# Patient Record
Sex: Female | Born: 2000 | Race: Black or African American | Hispanic: No | Smoking: Never smoker
Health system: Southern US, Community
[De-identification: ages and names within clinical notes are randomized; demographics above are authoritative.]

## PROBLEM LIST (undated history)

## (undated) ENCOUNTER — Inpatient Hospital Stay (HOSPITAL_COMMUNITY): Payer: Self-pay

## (undated) DIAGNOSIS — I10 Essential (primary) hypertension: Secondary | ICD-10-CM

## (undated) DIAGNOSIS — O139 Gestational [pregnancy-induced] hypertension without significant proteinuria, unspecified trimester: Secondary | ICD-10-CM

## (undated) DIAGNOSIS — Z789 Other specified health status: Secondary | ICD-10-CM

## (undated) DIAGNOSIS — F99 Mental disorder, not otherwise specified: Secondary | ICD-10-CM

## (undated) HISTORY — DX: Mental disorder, not otherwise specified: F99

## (undated) HISTORY — DX: Essential (primary) hypertension: I10

## (undated) HISTORY — PX: TUMOR REMOVAL: SHX12

## (undated) HISTORY — DX: Gestational (pregnancy-induced) hypertension without significant proteinuria, unspecified trimester: O13.9

---
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
## 2019-01-06 ENCOUNTER — Other Ambulatory Visit: Payer: Self-pay

## 2019-01-06 ENCOUNTER — Ambulatory Visit (INDEPENDENT_AMBULATORY_CARE_PROVIDER_SITE_OTHER): Payer: Medicaid Other | Admitting: Student in an Organized Health Care Education/Training Program

## 2019-01-06 ENCOUNTER — Encounter: Payer: Self-pay | Admitting: Student in an Organized Health Care Education/Training Program

## 2019-01-06 VITALS — BP 92/62 | HR 66 | Wt 104.8 lb

## 2019-01-06 DIAGNOSIS — Z538 Procedure and treatment not carried out for other reasons: Secondary | ICD-10-CM

## 2019-02-18 ENCOUNTER — Encounter: Payer: Self-pay | Admitting: Student in an Organized Health Care Education/Training Program

## 2019-03-26 IMAGING — US US OB TRANSVAGINAL
1 series · 15 of 28 positions shown · non-contrast
Comparison: 09/16/2017

CLINICAL DATA: First trimester pregnancy with inconclusive fetal
viability. Current assigned gestational age of 8 weeks 6 days by
prior ultrasound.

EXAM:
TRANSVAGINAL OB ULTRASOUND
TECHNIQUE: Transvaginal ultrasound was performed for complete evaluation of the
gestation as well as the maternal uterus, adnexal regions, and
pelvic cul-de-sac.

[Series 1: us ob transvaginal · 29 acquisitions, 15 frames shown]
[im 1/29]
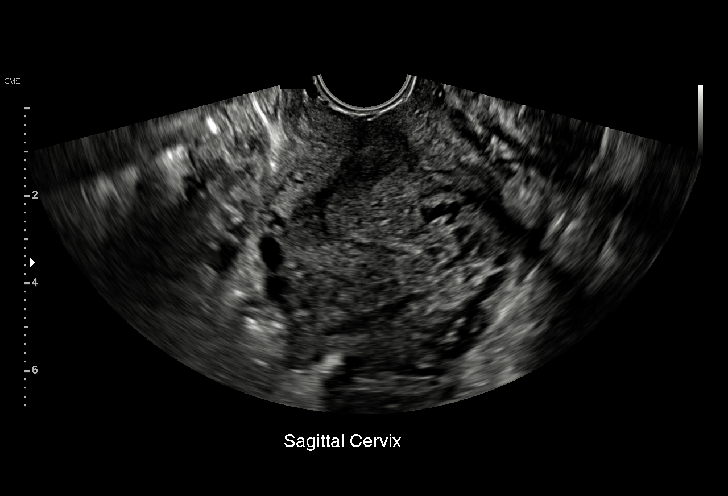
[im 3/29]
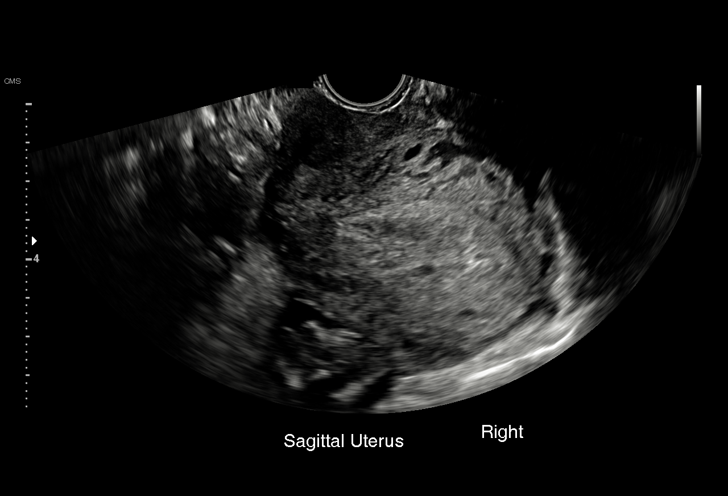
[im 5/29]
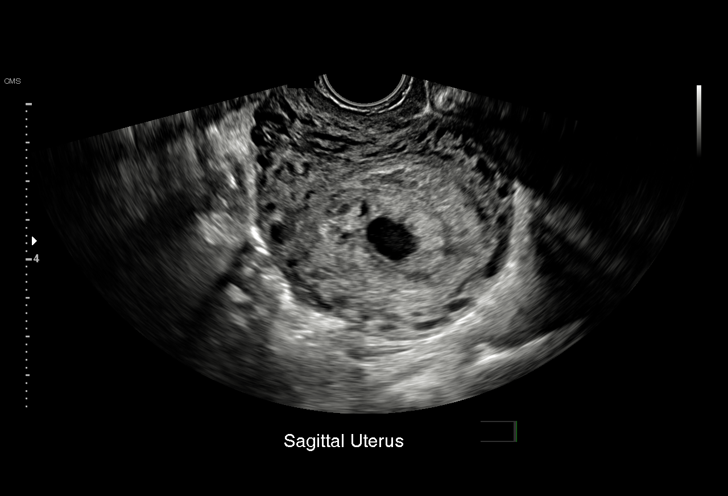
[im 7/29]
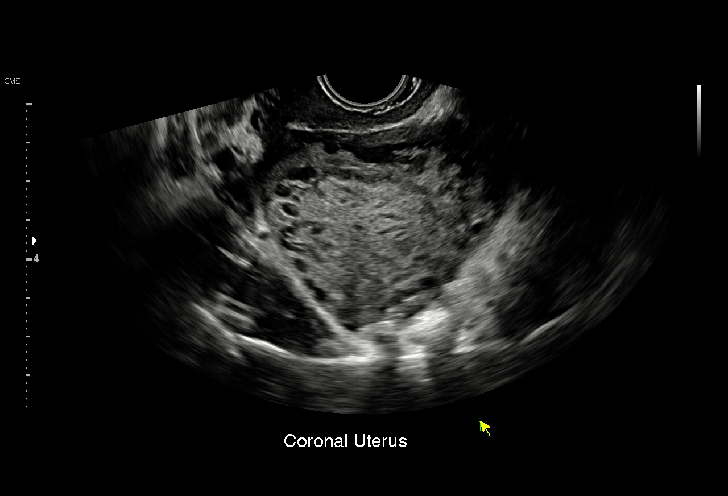
[im 9/29]
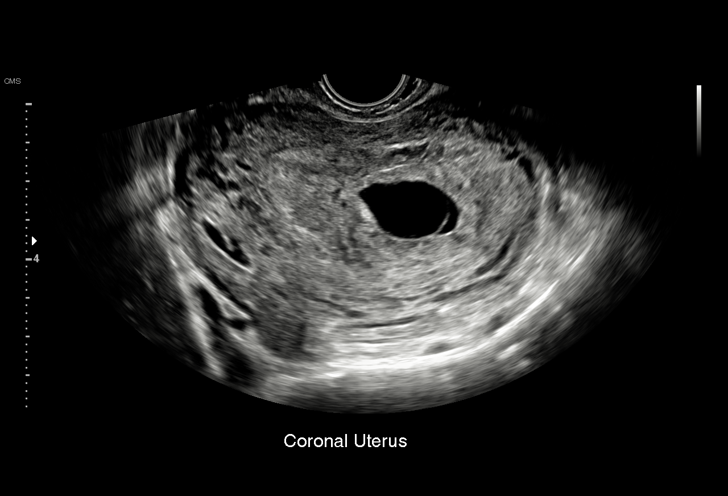
[im 11/29]
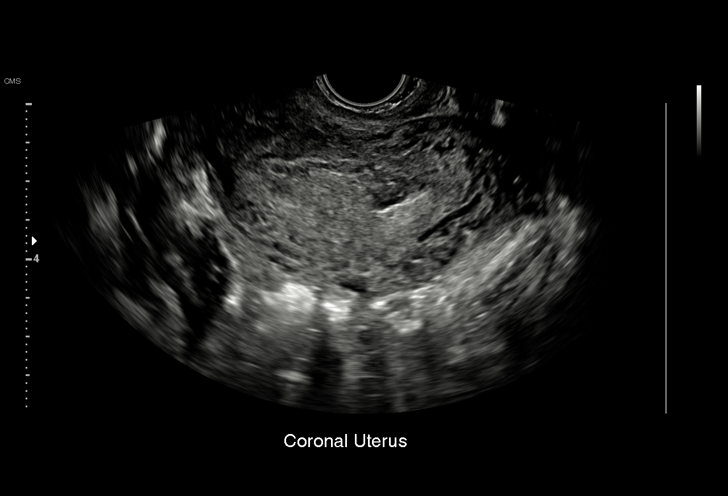
[im 13/29]
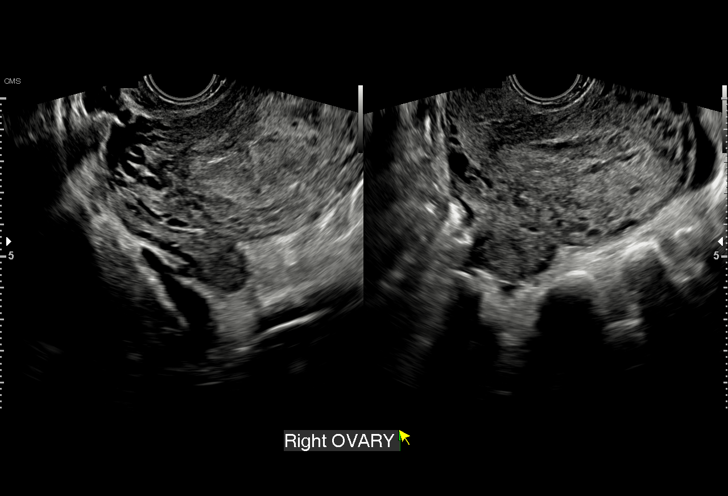
[im 15/29]
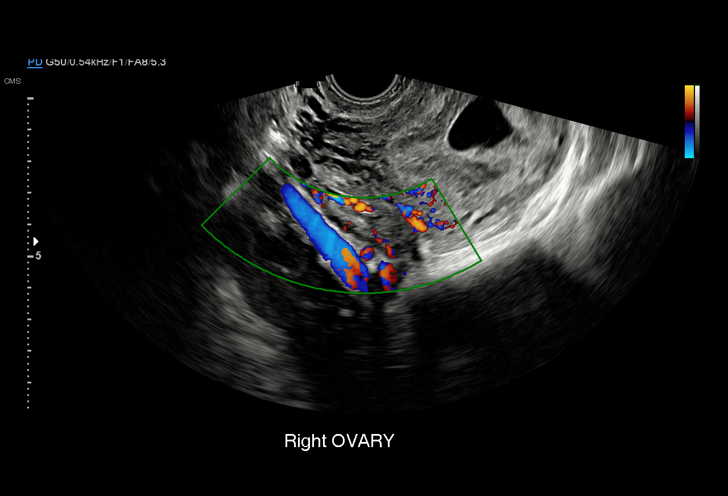
[im 16/29]
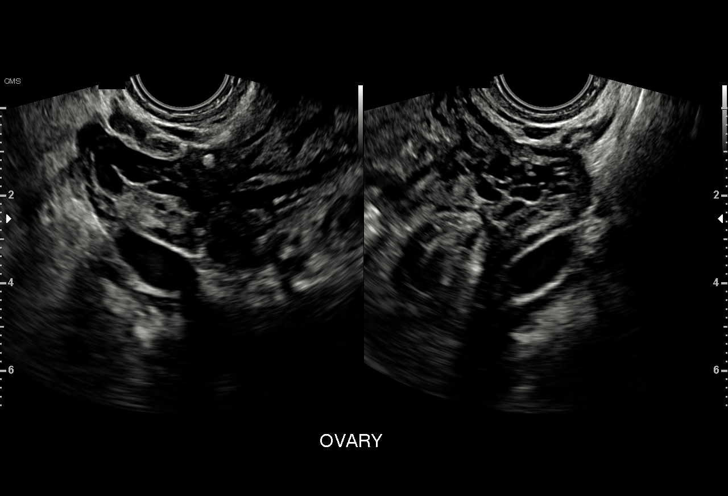
[im 18/29]
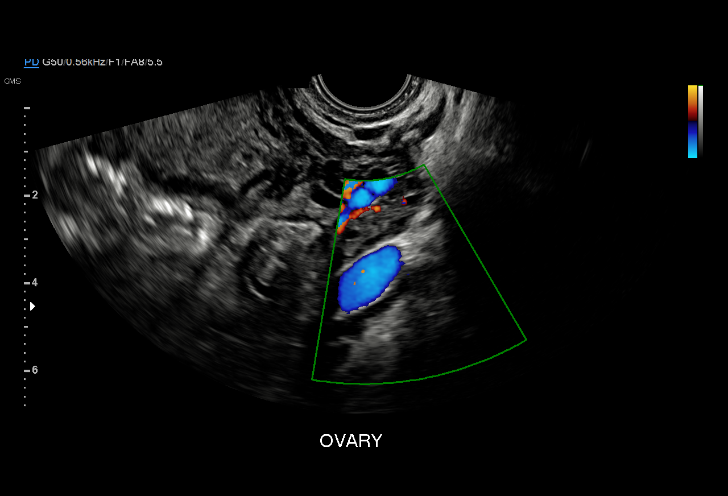
[im 20/29]
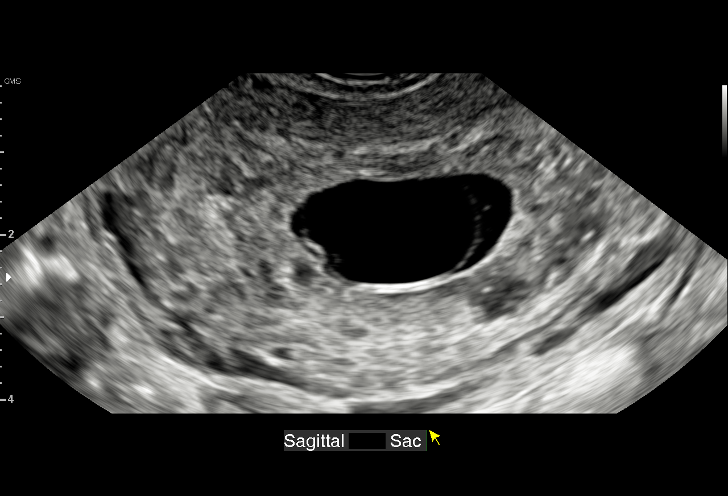
[im 22/29]
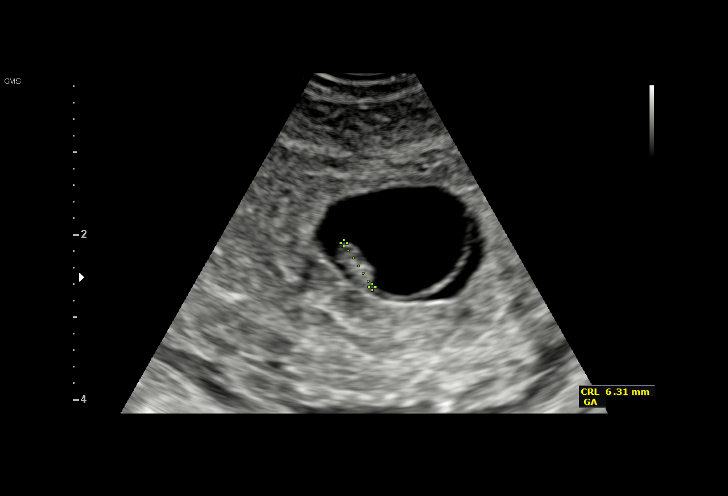
[im 24/29]
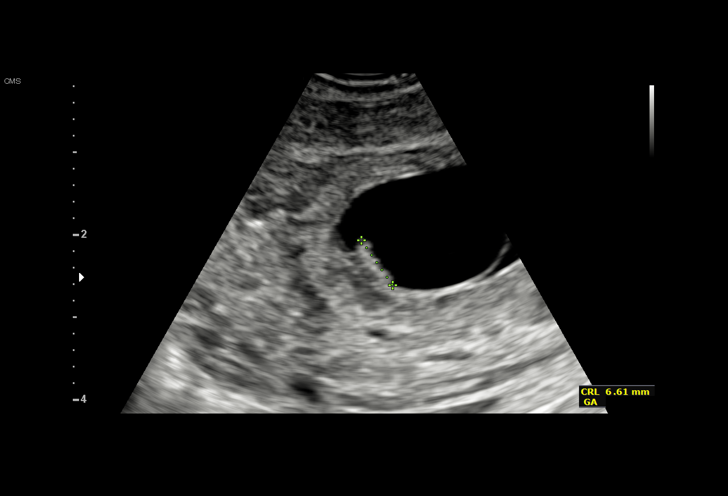
[im 26/29]
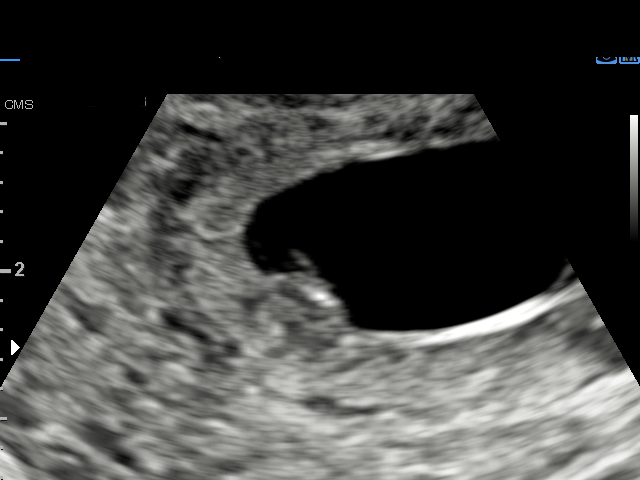
[im 29/29]
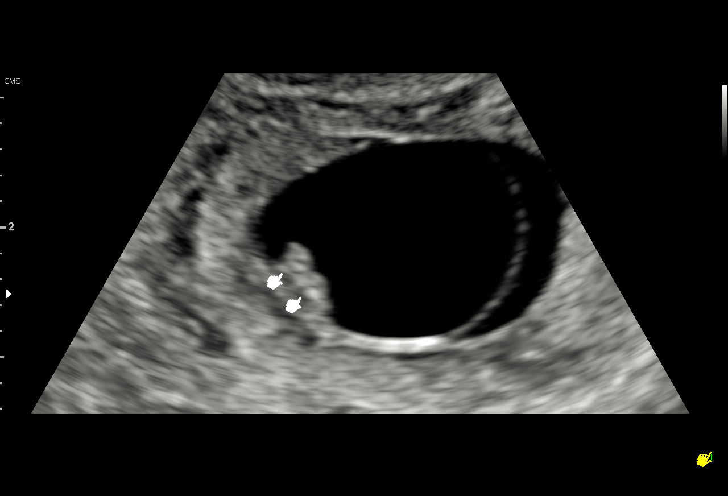

[15 of 28 positions shown; findings below may reference images not displayed]

FINDINGS: Intrauterine gestational sac: Single

Yolk sac:  Visualized.

Embryo:  Visualized.

Cardiac Activity: Absent

CRL:   6 mm   6 w 3 d                  US EDC: 05/29/2018

Subchorionic hemorrhage:  None visualized.

Maternal uterus/adnexae: Normal appearance of both ovaries. No mass
or abnormal free fluid identified.
IMPRESSION: Single IUP with absence of cardiac activity and lack of progression
since previous study, consistent with failed IUP.

## 2019-04-10 ENCOUNTER — Other Ambulatory Visit: Payer: Self-pay

## 2019-04-10 ENCOUNTER — Ambulatory Visit (INDEPENDENT_AMBULATORY_CARE_PROVIDER_SITE_OTHER): Payer: Medicaid Other | Admitting: Family Medicine

## 2019-04-10 VITALS — BP 101/80 | HR 82 | Wt 102.8 lb

## 2019-04-10 DIAGNOSIS — Z30019 Encounter for initial prescription of contraceptives, unspecified: Secondary | ICD-10-CM

## 2019-04-10 LAB — POCT URINE PREGNANCY: Preg Test, Ur: NEGATIVE

## 2019-04-10 MED ORDER — XULANE 150-35 MCG/24HR TD PTWK: 1.0000 | MEDICATED_PATCH | TRANSDERMAL | 12 refills | Status: DC

## 2019-04-10 NOTE — Progress Notes (Signed)
    SUBJECTIVE:   CHIEF COMPLAINT / HPI:   Discuss birth control Patient presents today to discuss birth control.  Patient would like the patch.  Has used patches in the past and liked them.  States that they worked for her because they regulated her periods as well.  She recently had a child 5 months ago and has not been on any birth control.  Is not breast-feeding her children.  Has another child that is almost 20 years old.  Last intercourse was in over 1 to 2 weeks ago, was unprotected at the time.  Patient does not desire any STD testing today.  LMP February 22-26.  Denies h/o blood clots in herself, her sister did have a blood clot. Denies h/o migraines. Not interested in any LARCs.  PERTINENT  PMH / PSH: h/o gestational HTN   OBJECTIVE:   BP 101/80   Pulse 82   Wt 102 lb 12.8 oz (46.6 kg)   SpO2 96%   BMI 17.65 kg/m   Gen: Awake and alert, no apparent distress Cardio: RRR, no MRG  Resp: CTAB, no increased WOB Ext: no edema, no tenderness   ASSESSMENT/PLAN:   Contraception management Patient with desire for Xulane patches.  Last intercourse was over 1 to 2 weeks ago.  Pregnancy test today was negative.  Patient has tolerated Xulane patches in the past without any difficulty.  We'll plan to represcribe cooling patches.  Advised to follow-up if there is developed side effects.  Patient is no longer breast-feeding and was able to use estrogen containing birth control products.  Discussed LARCs with patient, she does not desire any at this time but will plan to schedule appointment with me if she does.     Oralia Manis, DO  PGY-3 [facility]

## 2019-04-10 NOTE — Assessment & Plan Note (Signed)
Patient with desire for Xulane patches.  Last intercourse was over 1 to 2 weeks ago.  Pregnancy test today was negative.  Patient has tolerated Xulane patches in the past without any difficulty.  We'll plan to represcribe cooling patches.  Advised to follow-up if there is developed side effects.  Patient is no longer breast-feeding and was able to use estrogen containing birth control products.  Discussed LARCs with patient, she does not desire any at this time but will plan to schedule appointment with me if she does.

## 2019-04-10 NOTE — Patient Instructions (Signed)

## 2019-05-01 ENCOUNTER — Encounter: Payer: Self-pay | Admitting: Family Medicine

## 2019-05-01 ENCOUNTER — Other Ambulatory Visit: Payer: Self-pay | Admitting: Family Medicine

## 2019-05-01 MED ORDER — XULANE 150-35 MCG/24HR TD PTWK: 1.0000 | MEDICATED_PATCH | TRANSDERMAL | 12 refills | Status: DC

## 2019-05-19 ENCOUNTER — Encounter: Payer: Self-pay | Admitting: Family Medicine

## 2019-05-19 ENCOUNTER — Ambulatory Visit: Payer: Self-pay | Admitting: *Deleted

## 2019-05-19 NOTE — Telephone Encounter (Signed)
Pt complains of vaginal pain when she sits, and a pus- like discharge; the discharge does not have an odor; she rates her pain at 2-3 out of 10; recommendations made per nurse triage protocol; she verbalized understanding.  Reason for Disposition . [1] MILD-MODERATE pain AND [2] present > 24 hours  Answer Assessment - Initial Assessment Questions 1. SYMPTOM: "What's the main symptom you're concerned about?" (e.g., pain, itching, dryness)     pain 2. LOCATION: "Where is the  located?" (e.g., inside/outside, left/right)    inside 3. ONSET: "When did the  start?"     05/15/19 4. PAIN: "Is there any pain?" If so, ask: "How bad is it?" (Scale: 1-10; mild, moderate, severe)   pain 5. ITCHING: "Is there any itching?" If so, ask: "How bad is it?" (Scale: 1-10; mild, moderate, severe)     none 6. CAUSE: "What do you think is causing the discharge?" "Have you had the same problem before? What happened then?"     Not sure 7. OTHER SYMPTOMS: "Do you have any other symptoms?" (e.g., fever, itching, vaginal bleeding, pain with urination, injury to genital area, vaginal foreign body)    Bumps in vagina, external irriation 8. PREGNANCY: "Is there any chance you are pregnant?" "When was your last menstrual period?"     Patch; LMP 03/31/19  Protocols used: VAGINAL [facility]

## 2019-08-02 NOTE — Progress Notes (Signed)
    SUBJECTIVE:   CHIEF COMPLAINT / HPI:  Nausea and possible pregnancy.  Patient reports that she took home pregnancy test and was negative but she has been feeling nauseated like she is pregnant.  LMP 06/21 for 6 days but normally 3 days.  She started the patch about 3-4 months ago and reports compliancy weekly on Mondays. She is sexually active with one female partner in the last month and half. She endorses a lot of white vaginal discharge with a sweet odor.  Denies any vaginal itching, dyspareunia or urinary symptoms.  Reports some spotting yesterday.  She also endorses nausea x 2 weeks.  She reports that her appetite initially was poor but it has now increased.   PERTINENT  PMH / PSH:  [id number]  OBJECTIVE:   BP 100/76   Pulse 87   Ht 5\' 4"  (1.626 m)   Wt 97 lb 6.4 oz (44.2 kg)   SpO2 100%   BMI 16.72 kg/m    General: Alert and oriented, no apparent distress  Gastrointestinal: Bowel sounds present. No abdominal pain. Flat abdomen and non distension SVE: yellow vaginal discharge noted, cervix and vaginal vault appear normal, no lesions or abrasions Bimanual: No CMT, no adnexal masses    ASSESSMENT/PLAN:   Non-pregnancy nausea Nausea likely secondary to contraceptive patch -Urine preg test negative -BHcg quant negative -wet mount positive for BV -Flagyl 500 mg BID x 7 days -Zofran 4mg  ODT q8h prn -If continues to have nausea consider switching contraceptive method. -Follow up with PCP as needed     , MD [facility]

## 2019-08-02 NOTE — Patient Instructions (Addendum)
Thank you for coming to see me today. It was a pleasure.   Will call you with results if abnormal  Please follow-up with PCP as needed  If you have any questions or concerns, please do not hesitate to call the office at [phone].  Best,   Dana Allan, MD [facility]

## 2019-08-03 ENCOUNTER — Ambulatory Visit (INDEPENDENT_AMBULATORY_CARE_PROVIDER_SITE_OTHER): Payer: Medicaid Other | Admitting: Family Medicine

## 2019-08-03 ENCOUNTER — Other Ambulatory Visit: Payer: Self-pay

## 2019-08-03 ENCOUNTER — Other Ambulatory Visit (HOSPITAL_COMMUNITY)
Admission: RE | Admit: 2019-08-03 | Discharge: 2019-08-03 | Disposition: A | Discharge status: Home / Self Care | Payer: Medicaid Other | Source: Ambulatory Visit | Attending: Family Medicine | Admitting: Family Medicine

## 2019-08-03 VITALS — BP 100/76 | HR 87 | Ht 64.0 in | Wt 97.4 lb

## 2019-08-03 DIAGNOSIS — R110 Nausea: Secondary | ICD-10-CM

## 2019-08-03 DIAGNOSIS — Z3200 Encounter for pregnancy test, result unknown: Secondary | ICD-10-CM | POA: Insufficient documentation

## 2019-08-03 LAB — POCT WET PREP (WET MOUNT)
Clue Cells Wet Prep Whiff POC: POSITIVE
Trichomonas Wet Prep HPF POC: ABSENT
WBC, Wet Prep HPF POC: >20

## 2019-08-03 LAB — POCT URINE PREGNANCY: Preg Test, Ur: NEGATIVE

## 2019-08-03 MED ORDER — ONDANSETRON 4 MG PO TBDP: 4.0000 mg | ORAL_TABLET | Freq: Three times a day (TID) | ORAL | 0 refills | Status: DC | PRN

## 2019-08-04 ENCOUNTER — Encounter: Payer: Self-pay | Admitting: Family Medicine

## 2019-08-04 LAB — BETA HCG QUANT (REF LAB): hCG Quant: <1 m[IU]/mL

## 2019-08-04 LAB — CERVICOVAGINAL ANCILLARY ONLY
Bacterial Vaginitis (gardnerella): POSITIVE — AB
Chlamydia: NEGATIVE
Comment: NEGATIVE
Comment: NEGATIVE
Comment: NORMAL
Neisseria Gonorrhea: NEGATIVE

## 2019-08-04 LAB — HCV INTERPRETATION

## 2019-08-04 LAB — HCV AB W REFLEX TO QUANT PCR: HCV Ab: <0.1 s/co ratio (ref 0.0–0.9)

## 2019-08-04 MED ORDER — METRONIDAZOLE 500 MG PO TABS
500.0000 mg | ORAL_TABLET | Freq: Two times a day (BID) | ORAL | 0 refills | Status: AC
Start: 2019-08-04 — End: 2019-08-11

## 2019-08-07 ENCOUNTER — Encounter: Payer: Self-pay | Admitting: Family Medicine

## 2019-08-07 DIAGNOSIS — R110 Nausea: Secondary | ICD-10-CM | POA: Insufficient documentation

## 2019-08-07 NOTE — Assessment & Plan Note (Signed)
Nausea likely secondary to contraceptive patch -Urine preg test negative -BHcg quant negative -wet mount positive for BV -Flagyl 500 mg BID x 7 days -Zofran 4mg  ODT q8h prn -If continues to have nausea consider switching contraceptive method. -Follow up with PCP as needed

## 2019-08-09 ENCOUNTER — Encounter: Payer: Self-pay | Admitting: Family Medicine

## 2019-10-09 ENCOUNTER — Encounter: Payer: Self-pay | Admitting: Student in an Organized Health Care Education/Training Program

## 2019-10-10 ENCOUNTER — Encounter: Payer: Self-pay | Admitting: Student in an Organized Health Care Education/Training Program

## 2019-10-10 ENCOUNTER — Other Ambulatory Visit: Payer: Self-pay | Admitting: Student in an Organized Health Care Education/Training Program

## 2020-02-11 IMAGING — US US MFM OB FOLLOW UP
1 series · 14 of 28 positions shown · non-contrast
Comparison: none

[Series 1: us mfm ob follow up · 83 acquisitions, 14 frames shown]
[im 4/83]
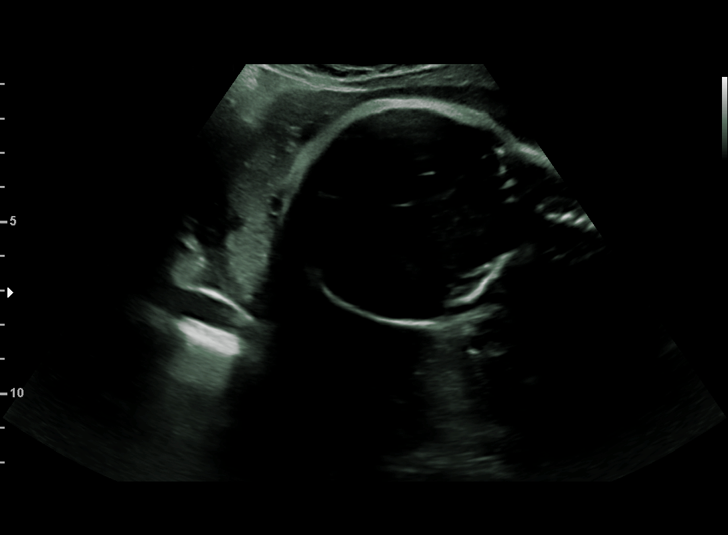
[im 10/83]
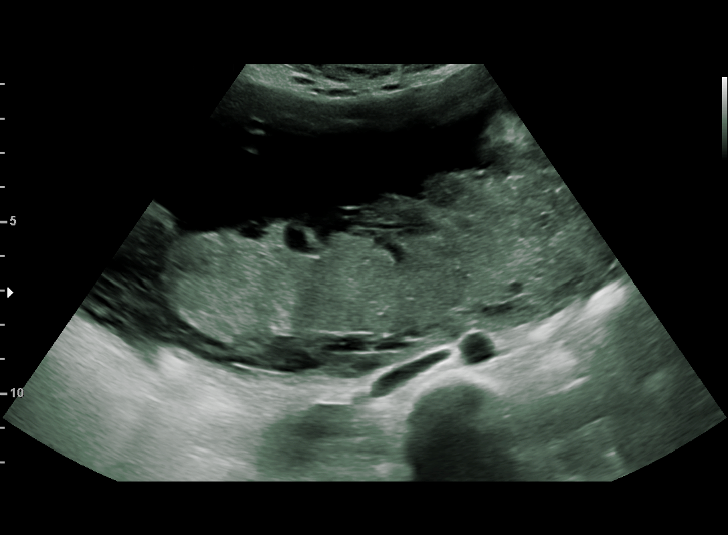
[im 16/83]
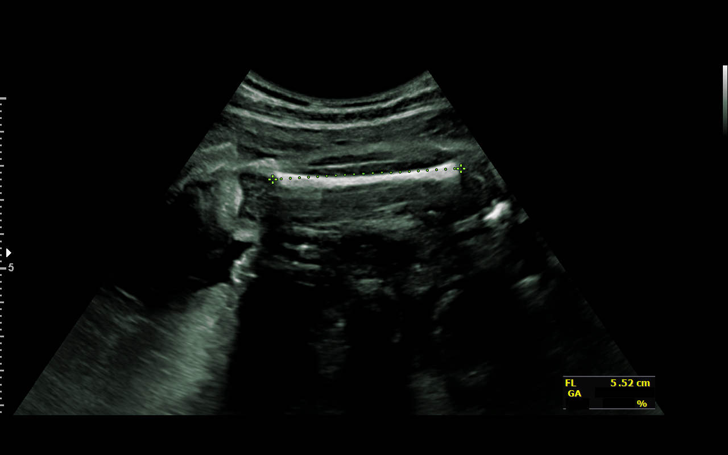
[im 22/83]
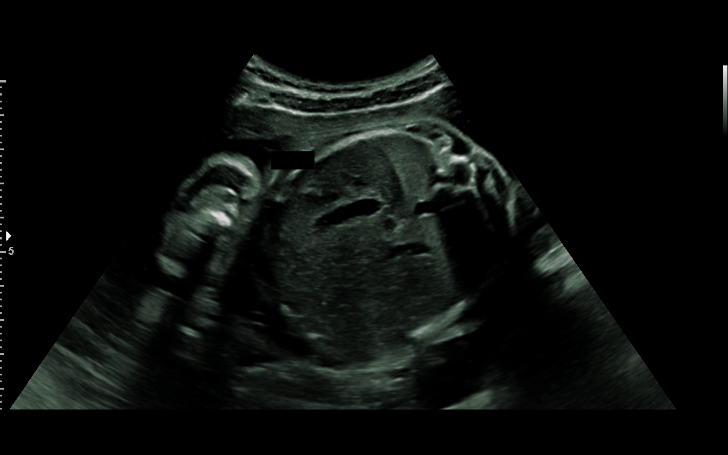
[im 28/83]
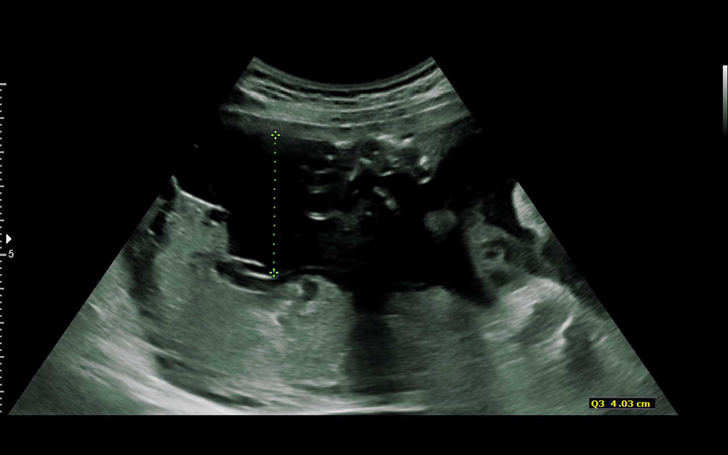
[im 34/83]
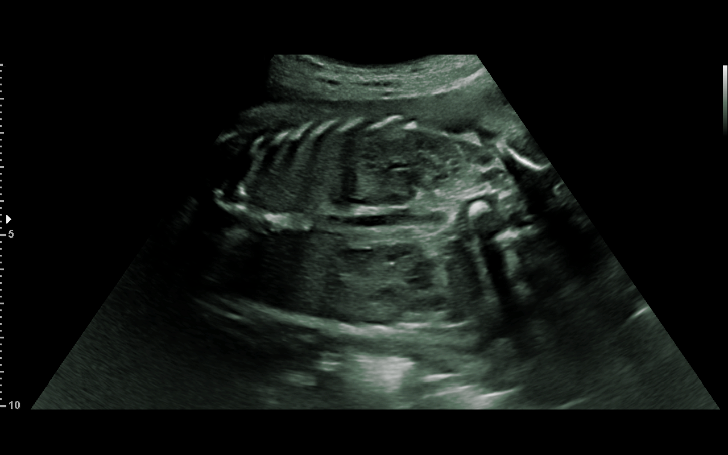
[im 40/83]
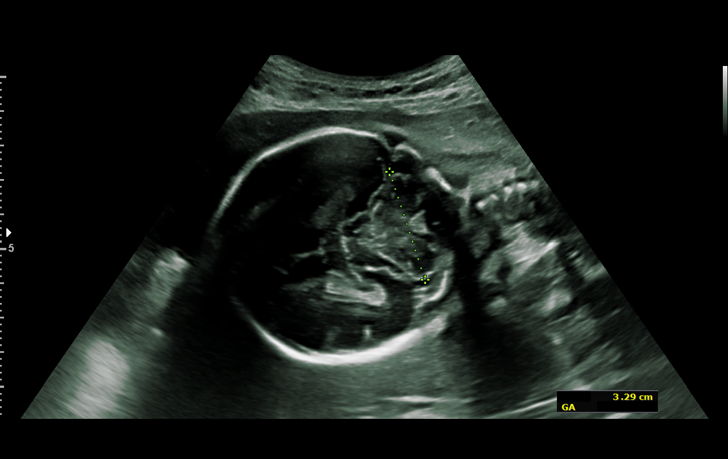
[im 46/83]
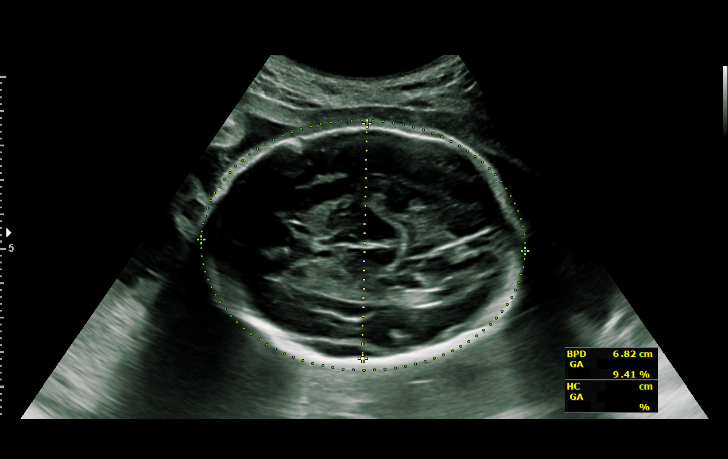
[im 52/83]
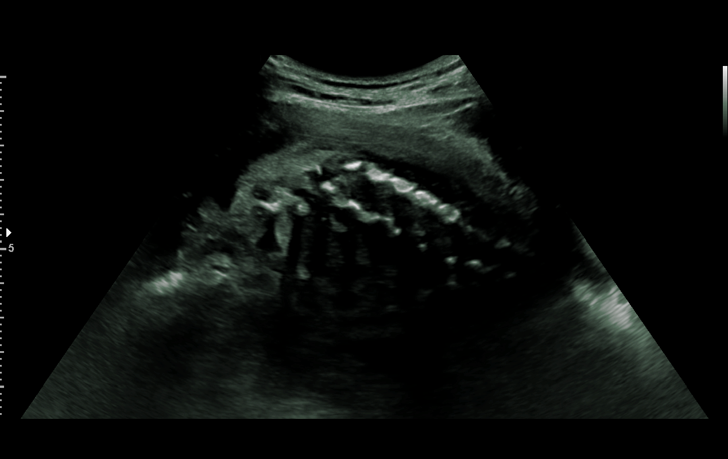
[im 58/83]
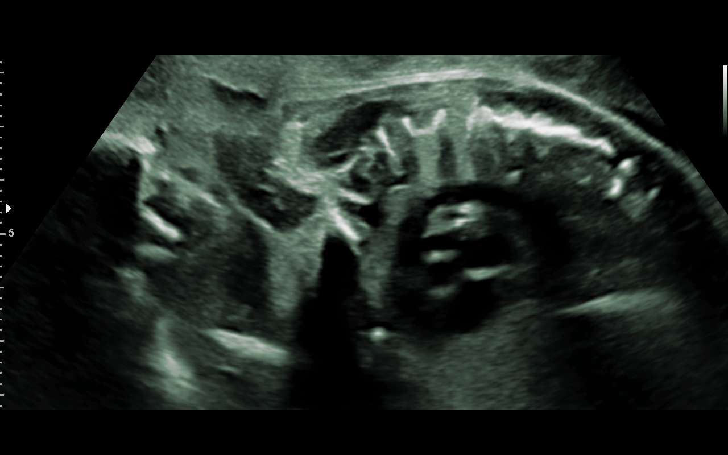
[im 64/83]
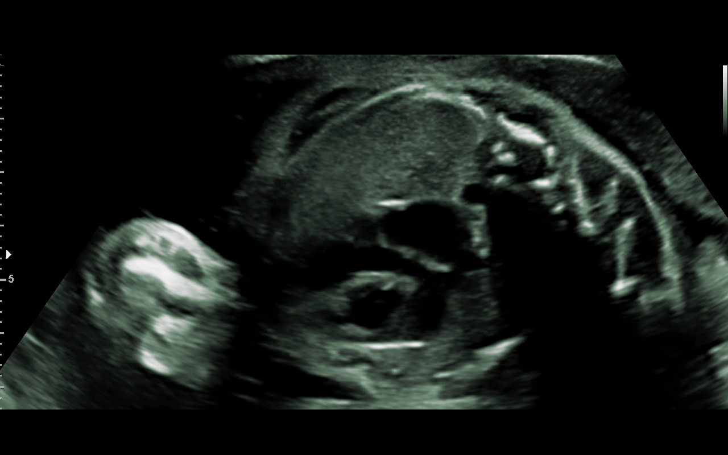
[im 70/83]
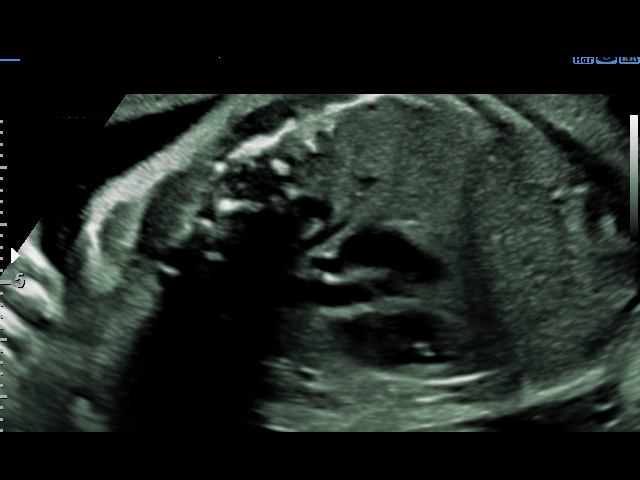
[im 76/83]
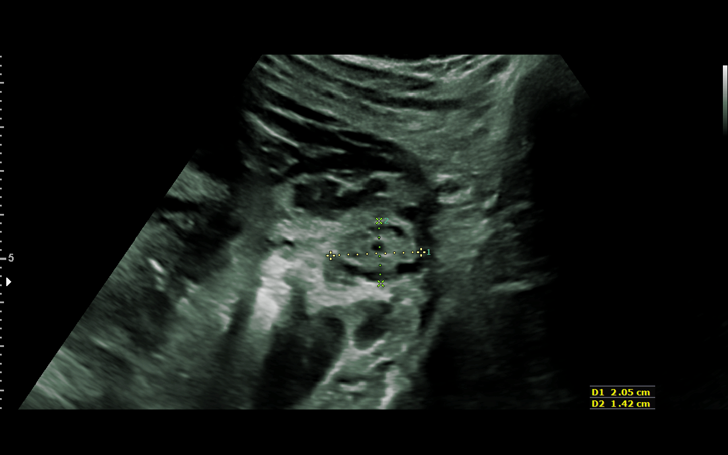
[im 83/83]
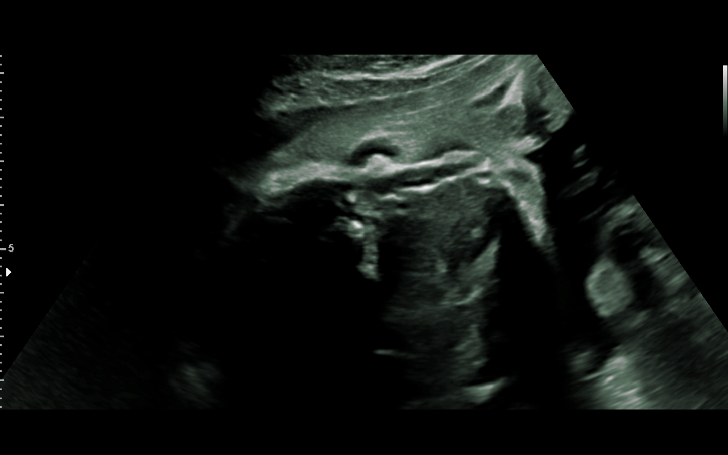

[14 of 28 positions shown; findings below may reference images not displayed]

Medicine Center
                                                             [id number]
                                                             [place]

                                                       [place]
 ----------------------------------------------------------------------

 ----------------------------------------------------------------------
Indications

  Encounter for other antenatal screening
  follow-up
  28 weeks gestation of pregnancy
 ----------------------------------------------------------------------
Vital Signs

                                                Height:        5'4"
Fetal Evaluation

 Num Of Fetuses:          1
 Fetal Heart Rate(bpm):   149
 Cardiac Activity:        Observed
 Presentation:            Frank breech
 Placenta:                Posterior
 P. Cord Insertion:       Marginal insertion - prev seen

 Amniotic Fluid
 AFI FV:      Within normal limits

 AFI Sum(cm)     %Tile       Largest Pocket(cm)
 10.34           14

 RUQ(cm)                     LUQ(cm)        LLQ(cm)

Biometry
 BPD:      68.3  mm     G. Age:  27w 3d         10  %    CI:          68.8  %    70 - 86
                                                         FL/HC:       20.8  %    19.6 -
 HC:      263.1  mm     G. Age:  28w 4d         20  %    HC/AC:       1.11       0.99 -
 AC:       238   mm     G. Age:  28w 1d         29  %    FL/BPD:      80.2  %    71 - 87
 FL:       54.8  mm     G. Age:  29w 0d         45  %    FL/AC:       23.0  %    20 - 24
 HUM:      50.6  mm     G. Age:  29w 5d         66  %
 CER:      32.5  mm     G. Age:  28w 4d         49  %
 LV:        4.4  mm

 Est. FW:    6005   gm   2 lb 11 oz      30  %
OB History

 Gravidity:    3         Term:   1        Prem:   0        SAB:   1
 TOP:          0       Ectopic:  0        Living: 1
Gestational Age

 U/S Today:     28w 2d                                        EDD:   11/14/18
 Best:          28w 4d     Det. By:  U/S  (05/18/18)          EDD:   11/12/18
Anatomy

 Cranium:               Appears normal         LVOT:                   Appears normal
 Cavum:                 Appears normal         Aortic Arch:            Appears normal
 Ventricles:            Appears normal         Ductal Arch:            Appears normal
 Choroid Plexus:        Appears normal         Diaphragm:              Appears normal
 Cerebellum:            Appears normal         Stomach:                Appears normal, left
                                                                       sided
 Posterior Fossa:       Appears normal         Abdomen:                Appears normal
 Nuchal Fold:           Previously seen        Abdominal Wall:         Appears nml (cord
                                                                       insert, abd wall)
 Face:                  Appears normal         Cord Vessels:           Appears normal (3
                        (orbits and profile)                           vessel cord)
 Lips:                  Appears normal         Kidneys:                Appear normal
 Palate:                Appears normal         Bladder:                Appears normal
 Thoracic:              Appears normal         Spine:                  Appears normal
 Heart:                 Appears normal         Upper Extremities:      Previously seen
                        (4CH, axis, and
                        situs)
 RVOT:                  Appears normal         Lower Extremities:      Previously seen

 Other:  Heels and 5th digit previously seen. 3VV and 3VTV prev. visualized.
         Male gender.  Nasal bone previously seen.
Cervix Uterus Adnexa

 Cervix
 Not visualized (advanced GA >83wks)

 Left Ovary
 Previously seen.

 Right Ovary
 Within normal limits.
Impression

 Amniotic fluid is normal and good fetal activity is seen. Fetal
 growth is appropriate for gestational age. Marginal cord
 insertion is seen again.
Recommendations

 Follow-up scans as clinically indicated.
                 Braune, Uises

## 2020-02-15 ENCOUNTER — Ambulatory Visit: Payer: Medicaid Other

## 2020-02-17 NOTE — Patient Instructions (Incomplete)
Thank you for coming to see me today. It was a pleasure. Today we talked about:  ° ° ° °Please follow-up with *** in *** ° °If you have any questions or concerns, please do not hesitate to call the office at [phone]. ° °Best,  ° °Yasheka Fossett, MD   °

## 2020-02-17 NOTE — Progress Notes (Deleted)
    SUBJECTIVE:   CHIEF COMPLAINT / HPI:   Primary symptom:*** Duration:*** Severity:*** Associated symptoms:*** Fever? Tmax?: *** Sick contacts:*** Covid test:*** Covid vaccination(s):***  PERTINENT  PMH / PSH: ***  OBJECTIVE:   There were no vitals taken for this visit.   General: Alert, no acute distress Cardio: Normal S1 and S2, RRR, no r/m/g Pulm: CTAB, normal work of breathing Abdomen: Bowel sounds normal. Abdomen soft and non-tender.  Extremities: No peripheral edema.  Neuro: Cranial nerves grossly intact   ASSESSMENT/PLAN:   No problem-specific Assessment & Plan notes found for this encounter.     Dana Allan, MD [facility]

## 2020-02-20 ENCOUNTER — Ambulatory Visit: Payer: Medicaid Other

## 2020-04-16 ENCOUNTER — Other Ambulatory Visit: Payer: Self-pay

## 2020-04-16 ENCOUNTER — Encounter (HOSPITAL_COMMUNITY): Payer: Self-pay

## 2020-04-16 ENCOUNTER — Ambulatory Visit (HOSPITAL_COMMUNITY)
Admission: EM | Admit: 2020-04-16 | Discharge: 2020-04-16 | Disposition: A | Discharge status: Home / Self Care | Payer: Medicaid Other | Attending: Emergency Medicine | Admitting: Emergency Medicine

## 2020-04-16 DIAGNOSIS — J020 Streptococcal pharyngitis: Secondary | ICD-10-CM | POA: Diagnosis not present

## 2020-04-16 DIAGNOSIS — J029 Acute pharyngitis, unspecified: Secondary | ICD-10-CM | POA: Diagnosis not present

## 2020-04-16 DIAGNOSIS — R42 Dizziness and giddiness: Secondary | ICD-10-CM | POA: Insufficient documentation

## 2020-04-16 DIAGNOSIS — Z7722 Contact with and (suspected) exposure to environmental tobacco smoke (acute) (chronic): Secondary | ICD-10-CM | POA: Insufficient documentation

## 2020-04-16 DIAGNOSIS — Z880 Allergy status to penicillin: Secondary | ICD-10-CM | POA: Diagnosis not present

## 2020-04-16 DIAGNOSIS — R519 Headache, unspecified: Secondary | ICD-10-CM | POA: Diagnosis not present

## 2020-04-16 DIAGNOSIS — Z20822 Contact with and (suspected) exposure to covid-19: Secondary | ICD-10-CM | POA: Diagnosis not present

## 2020-04-16 LAB — POCT RAPID STREP A, ED / UC: Streptococcus, Group A Screen (Direct): POSITIVE — AB

## 2020-04-16 LAB — SARS CORONAVIRUS 2 (TAT 6-24 HRS): SARS Coronavirus 2: NEGATIVE

## 2020-04-16 LAB — POC INFLUENZA A AND B ANTIGEN (URGENT CARE ONLY)
Influenza A Ag: NEGATIVE
Influenza B Ag: NEGATIVE

## 2020-04-16 MED ORDER — LORATADINE 10 MG PO TABS: 10.0000 mg | ORAL_TABLET | Freq: Every day | ORAL | 1 refills | Status: DC

## 2020-04-16 MED ORDER — IBUPROFEN 400 MG PO TABS: 400.0000 mg | ORAL_TABLET | Freq: Four times a day (QID) | ORAL | 0 refills | Status: DC | PRN

## 2020-04-16 MED ORDER — AZITHROMYCIN 250 MG PO TABS: 250.0000 mg | ORAL_TABLET | Freq: Every day | ORAL | 0 refills | Status: DC

## 2020-04-16 NOTE — ED Triage Notes (Signed)
Pt c/o dizziness, headache and sore throat x 2 days. Pt states she took Robitussin at 0900.

## 2020-04-16 NOTE — ED Notes (Signed)
Obtained specimen for strep, influenza and covid :labeled and placed in minilab

## 2020-04-16 NOTE — ED Provider Notes (Signed)
[facility]    CSN: [id number] Arrival date & time: 04/16/20  1005      History   Chief Complaint Chief Complaint  Patient presents with  . Dizziness  . Sore Throat  . Headache    HPI Natalie Mason is a 20 y.o. female.   Patient presents with generalized aching headache, sore throat, fevers,chills and body aches starting 2 days ago. Endorses decreased appetite. Denies shortness of breath, chest pain, ear pain, sinus pressure, congestion, rhinorrhea, nausea, vomiting, diarrhea, abdominal pain. No known sick contacts. History of seasonal allergies.   Past Medical History:  Diagnosis Date  . Pregnancy induced hypertension     Patient Active Problem List   Diagnosis Date Noted  . Non-pregnancy nausea 08/07/2019  . SVD (spontaneous vaginal delivery) 11/06/2018  . Normal labor 11/04/2018  . History of gestational hypertension 10/23/2018  . High risk teen pregnancy 10/23/2018  . Pain in symphysis pubis during pregnancy 08/02/2018  . Group B streptococcal bacteriuria 05/19/2018  . Supervision of low-risk first pregnancy, second trimester 03/15/2018  . Vertigo 01/01/2017  . Contraception management 11/27/2014  . 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 TUMOR REMOVAL Left     OB History    Gravida  3   Para  2   Term  2   Preterm      AB  1   Living  2     SAB  1   IAB      Ectopic      Multiple  0   Live Births  2            Home Medications    Prior to Admission medications   Medication Sig Start Date End Date Taking? Authorizing Provider  ibuprofen (ADVIL) 400 MG tablet Take 1 tablet (400 mg total) by mouth every 6 (six) hours as needed. 04/16/20  Yes White, Adrienne R, NP  loratadine (CLARITIN) 10 MG tablet Take 1 tablet (10 mg total) by mouth daily. 04/16/20  Yes White, Elita Boone, NP  norelgestromin-ethinyl estradiol [facility] [employer]) 150-35 MCG/24HR transdermal patch Place 1  patch onto the skin once a week. 05/01/19   Darin Engels, Sherin, DO  ondansetron (ZOFRAN ODT) 4 MG disintegrating tablet Take 1 tablet (4 mg total) by mouth every 8 (eight) hours as needed for nausea or vomiting. 08/03/19   Dana Allan, MD    Family History Family History  Problem Relation Age of Onset  . Mental illness Mother   . Depression Mother   . Bipolar disorder Mother   . Hypertension Maternal Grandmother   . Diabetes Maternal Grandmother   . Bipolar disorder Sister   . Hearing loss Brother   . Anxiety disorder Maternal Uncle   . Mental illness Maternal Uncle     Social History Social History   Tobacco Use  . Smoking status: Passive Smoke Exposure - Never Smoker  . Smokeless tobacco: Never Used  Vaping Use  . Vaping Use: Never used  Substance Use Topics  . Alcohol use: No  . Drug use: No     Allergies   Penicillins   Review of Systems Review of Systems  Constitutional: Positive for appetite change, chills and fever. Negative for activity change, diaphoresis, fatigue and unexpected weight change.  HENT: Positive for sore throat. Negative for congestion, dental problem, drooling, ear discharge, ear pain, facial swelling, hearing loss, mouth sores, nosebleeds, postnasal drip, rhinorrhea, sinus pressure, sinus pain,  sneezing, tinnitus, trouble swallowing and voice change.   Respiratory: Negative.   Cardiovascular: Negative.   Gastrointestinal: Negative.   Genitourinary: Negative.   Musculoskeletal: Negative.   Skin: Negative.   Neurological: Positive for headaches. Negative for dizziness, tremors, seizures, syncope, facial asymmetry, speech difficulty, weakness, light-headedness and numbness.     Physical Exam Triage Vital Signs ED Triage Vitals  Enc Vitals Group     BP 04/16/20 1102 115/62     Pulse Rate 04/16/20 1102 99     Resp 04/16/20 1102 18     Temp 04/16/20 1102 97.8 F (36.6 C)     Temp Source 04/16/20 1102 Oral     SpO2 04/16/20 1102 99 %      Weight --      Height --      Head Circumference --      Peak Flow --      Pain Score 04/16/20 1101 7     Pain Loc --      Pain Edu? --      Excl. in GC? --    No data found.  Updated Vital Signs BP 115/62 (BP Location: Right Arm)   Pulse 99   Temp 97.8 F (36.6 C) (Oral)   Resp 18   LMP 03/12/2020 (Exact Date)   SpO2 99%   Visual Acuity Right Eye Distance:   Left Eye Distance:   Bilateral Distance:    Right Eye Near:   Left Eye Near:    Bilateral Near:     Physical Exam Constitutional:      Appearance: She is well-developed and normal weight.  HENT:     Head: Normocephalic.     Right Ear: Ear canal normal. A middle ear effusion is present.     Left Ear: Ear canal normal. A middle ear effusion is present.     Nose: Rhinorrhea present. No congestion.     Mouth/Throat:     Mouth: Mucous membranes are moist.     Pharynx: Posterior oropharyngeal erythema present. No pharyngeal swelling.     Tonsils: No tonsillar exudate or tonsillar abscesses.  Eyes:     Conjunctiva/sclera: Conjunctivae normal.     Pupils: Pupils are equal, round, and reactive to light.  Cardiovascular:     Rate and Rhythm: Normal rate and regular rhythm.     Heart sounds: Normal heart sounds.  Pulmonary:     Effort: Pulmonary effort is normal.     Breath sounds: Normal breath sounds.  Abdominal:     General: Bowel sounds are normal.     Palpations: Abdomen is soft.  Musculoskeletal:     Cervical back: Normal range of motion.  Lymphadenopathy:     Cervical: Cervical adenopathy present.  Skin:    General: Skin is warm and dry.  Neurological:     Mental Status: She is alert and oriented to person, place, and time.  Psychiatric:        Mood and Affect: Mood normal.        Behavior: Behavior normal.      UC Treatments / Results  Labs (all labs ordered are listed, but only abnormal results are displayed) Labs Reviewed  SARS CORONAVIRUS 2 (TAT 6-24 HRS)  POCT RAPID STREP A, ED / UC  POC  INFLUENZA A AND B ANTIGEN (URGENT CARE ONLY)    EKG   Radiology No results found.  Procedures Procedures (including critical care time)  Medications Ordered in UC Medications - No data to display  Initial Impression / Assessment and Plan / UC Course  I have reviewed the triage vital signs and the nursing notes.  Pertinent labs & imaging results that were available during my care of the patient were reviewed by me and considered in my medical decision making (see chart for details).  Viral URI  1. COVID- pending 2. Flu- pending 3. Rapid strep- positive, azithromycin 500 mg day 1, 250 mg day 2-5  4. claritin 10 mg daily at bedtime 5. Ibuprofen 400 mg every 6 hours as needed 6. Warm salt gargles and throat lozenges as needed  Final Clinical Impressions(s) / UC Diagnoses   Final diagnoses:  None     Discharge Instructions     Take Claritin daily at bedtime to help with allergies  Can use ibuprofen 400 mg every six hours as needed to help with headache  Warm salt water gargles and throat lozenges can be used to help with sore throat  Try to eat and drink fluids to maintain hydration      ED Prescriptions    Medication Sig Dispense Auth. Provider   loratadine (CLARITIN) 10 MG tablet Take 1 tablet (10 mg total) by mouth daily. 30 tablet White, Adrienne R, NP   ibuprofen (ADVIL) 400 MG tablet Take 1 tablet (400 mg total) by mouth every 6 (six) hours as needed. 30 tablet Valinda Hoar, NP     PDMP not reviewed this encounter.   Valinda Hoar, [place] 04/16/20 1207

## 2020-04-16 NOTE — Discharge Instructions (Addendum)
Take two antibiotic pills the first day and the 1 antibiotic pill for the following 4 days   Take Claritin daily at bedtime to help with allergies  Can use ibuprofen 400 mg every six hours as needed to help with headache  Warm salt water gargles and throat lozenges can be used to help with sore throat  Try to eat and drink fluids to maintain hydration

## 2020-05-20 DIAGNOSIS — H5213 Myopia, bilateral: Secondary | ICD-10-CM | POA: Diagnosis not present

## 2020-07-03 ENCOUNTER — Ambulatory Visit: Payer: Medicaid Other | Admitting: Family Medicine

## 2020-07-03 ENCOUNTER — Encounter: Payer: Self-pay | Admitting: Family Medicine

## 2020-07-03 ENCOUNTER — Other Ambulatory Visit: Payer: Self-pay

## 2020-07-03 VITALS — BP 113/80 | HR 76 | Ht 64.0 in | Wt 108.0 lb

## 2020-07-03 DIAGNOSIS — Z3490 Encounter for supervision of normal pregnancy, unspecified, unspecified trimester: Secondary | ICD-10-CM

## 2020-07-03 DIAGNOSIS — Z3201 Encounter for pregnancy test, result positive: Secondary | ICD-10-CM | POA: Diagnosis not present

## 2020-07-03 DIAGNOSIS — Z3200 Encounter for pregnancy test, result unknown: Secondary | ICD-10-CM

## 2020-07-03 LAB — POCT URINE PREGNANCY: Preg Test, Ur: POSITIVE — AB

## 2020-07-03 MED ORDER — PRENATAL PLUS IRON 29-1 MG PO TABS: 1.0000 | ORAL_TABLET | Freq: Every day | ORAL | 3 refills | Status: DC

## 2020-07-03 NOTE — Progress Notes (Signed)
    SUBJECTIVE:   CHIEF COMPLAINT / HPI:   Pregnancy Test Patient presents today for pregnancy test.  States that she has taken 2 home tests, one was positive and 1 was faint and hard to tell.  She has felt more hungry recently.  Also had some nausea last week and the week before.  Her periods are typically regular.  Her last menstrual period started 3/21 and went through 3/26.  She is not currently on birth control, states she was previously on the patch but had adverse side effects.  She has unprotected intercourse with 1 female partner.  She is not concerned for any sexually transmitted infections at this time.  She has 2 children, ages 76 and 1.  2 prior vaginal deliveries without complications.  States that with her first pregnancy she did have some hypertension that was diagnosed at the end of her pregnancy.  This would be a desired pregnancy.  PERTINENT  PMH / PSH:  Past Medical History:  Diagnosis Date  . Pregnancy induced hypertension     OBJECTIVE:   BP 113/80   Pulse 76   Ht 5\' 4"  (1.626 m)   Wt 108 lb (49 kg)   LMP 04/22/2020   SpO2 97%   BMI 18.54 kg/m    General: NAD, pleasant, able to participate in exam Respiratory: Breathing comfortably on room air, no respiratory distress Abdomen: Does not appear gravid Psych: Normal affect and mood  ASSESSMENT/PLAN:   Positive pregnancy test Confirmed positive pregnancy test today. Desired pregnancy.  By LMP her estimaged gestational age is 10 weeks 2 days.  Initial OB labs collected today.  She is scheduled for initial OB visit with me next week.  Prenatal vitamins prescribed today. -Follow-up prenatal labs -Rx prenatal vitamins sent -Schedule dating ultrasound next visit     04/24/2020, DO [facility]

## 2020-07-03 NOTE — Assessment & Plan Note (Addendum)
Confirmed positive pregnancy test today. Desired pregnancy.  By LMP her estimaged gestational age is 10 weeks 2 days.  Initial OB labs collected today.  She is scheduled for initial OB visit with me next week.  Prenatal vitamins prescribed today. -Follow-up prenatal labs -Rx prenatal vitamins sent -Schedule dating ultrasound next visit

## 2020-07-03 NOTE — Patient Instructions (Addendum)
It was wonderful to see you today.  Please bring ALL of your medications with you to every visit.   Your pregnancy test was positive!  We did some blood work and a urine sample to get you ready for your first OB appointment.   I sent prenatal vitamins to your pharmacy. Take these daily.   Thank you for choosing [facility].   Please call [phone] with any questions about today's appointment.  Please be sure to schedule follow up at the front  desk before you leave today.   Sabino Dick, DO PGY-1 Family Medicine

## 2020-07-05 LAB — HGB FRACTIONATION CASCADE
Hgb A2: 2.5 % (ref 1.8–3.2)
Hgb A: 97.5 % (ref 96.4–98.8)
Hgb F: 0 % (ref 0.0–2.0)
Hgb S: 0 %

## 2020-07-05 LAB — OBSTETRIC PANEL, INCLUDING HIV
Antibody Screen: NEGATIVE
Basophils Absolute: 0 10*3/uL (ref 0.0–0.2)
Basos: 0 %
EOS (ABSOLUTE): 0.2 10*3/uL (ref 0.0–0.4)
Eos: 3 %
HIV Screen 4th Generation wRfx: NONREACTIVE
Hematocrit: 35.4 % (ref 34.0–46.6)
Hemoglobin: 11.4 g/dL (ref 11.1–15.9)
Hepatitis B Surface Ag: NEGATIVE
Immature Grans (Abs): 0 10*3/uL (ref 0.0–0.1)
Immature Granulocytes: 0 %
Lymphocytes Absolute: 2.6 10*3/uL (ref 0.7–3.1)
Lymphs: 46 %
MCH: 28.9 pg (ref 26.6–33.0)
MCHC: 32.2 g/dL (ref 31.5–35.7)
MCV: 90 fL (ref 79–97)
Monocytes Absolute: 0.5 10*3/uL (ref 0.1–0.9)
Monocytes: 10 %
Neutrophils Absolute: 2.3 10*3/uL (ref 1.4–7.0)
Neutrophils: 41 %
Platelets: 297 10*3/uL (ref 150–450)
RBC: 3.95 x10E6/uL (ref 3.77–5.28)
RDW: 13.7 % (ref 11.7–15.4)
RPR Ser Ql: NONREACTIVE
Rh Factor: POSITIVE
Rubella Antibodies, IGG: 8.26 index (ref >0.99)
WBC: 5.6 10*3/uL (ref 3.4–10.8)

## 2020-07-05 LAB — URINE CULTURE, OB REFLEX

## 2020-07-05 LAB — HCV INTERPRETATION

## 2020-07-05 LAB — CULTURE, OB URINE

## 2020-07-05 LAB — HCV AB W REFLEX TO QUANT PCR: HCV Ab: <0.1 s/co ratio (ref 0.0–0.9)

## 2020-07-08 ENCOUNTER — Encounter: Payer: Self-pay | Admitting: Family Medicine

## 2020-07-08 NOTE — Progress Notes (Signed)
Patient Name: Natalie Mason Date of Birth: [birth year] [facility] Initial Prenatal Visit  Natalie Mason is a 20 y.o. year old G3P2012 at Unknown who presents for her initial prenatal visit. Pregnancy is "somewhat" planned She reports no current symptoms. Is interested in a water birth.  She picked up prenatal vitamin but states it is too large for her to take. She has difficulty with large pills. Would like a different prescription.  She denies pelvic pain or vaginal bleeding.   Pregnancy Dating: . The patient is dated by LMP.  . LMP: 3/21 . Period is certain:  Yes.  . Periods were regular:  Yes.  Marland Kitchen LMP was a typical period:  Yes.  . Using hormonal contraception in 3 months prior to conception: Yes  Lab Review: . Blood type: O . Rh Status: + . Antibody screen: Negative . HIV: Negative . RPR: Negative . Hemoglobin electrophoresis reviewed: Yes . Results of OB urine culture are: Negative . Rubella: Immune . Hep C Ab: Negative . Varicella status is Immune (believes she has previously been vaccinated)  PMH: Reviewed and as detailed below: . HTN: No  . Gestational Hypertension/preeclampsia: Yes  . Type 1 or 2 Diabetes: No  . Depression:  No  . Seizure disorder:  No . VTE: No ,  . History of STI No,  . Abnormal Pap smear:  No, . Genital herpes simplex:  No   PSH: . Gynecologic Surgery:  no . 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 Obstetric history tab updated and reviewed.  Summary of prior pregnancies:  OB History  Gravida Para Term Preterm AB Living  4 2 2   1 2   SAB IAB Ectopic Multiple Live Births  1     0 2    # Outcome Date GA Lbr Len/2nd Weight Sex Delivery Anes PTL Lv  4 Current           3 Term 11/04/18 [redacted]w[redacted]d 02:21 / 00:07 6 lb 6.7 oz (2.91 kg) M Vag-Spont None  LIV  2 Term 05/03/17 [redacted]w[redacted]d 08:38 / 00:16 6 lb 4.2 oz (2.84 kg) M Vag-Spont  None  LIV  1 SAB            .  [redacted]w[redacted]d Cesarean delivery: No  . Gestational Diabetes:  No . Hypertension in pregnancy: Yes . History of preterm birth: No . History of LGA/SGA infant:  No . History of shoulder dystocia: No . Indications for referral were reviewed, and the patient has no obstetric indications for referral to High Risk OB Clinic at this time.   Social History: . Partner's name: Marland Kitchen Cord  . Tobacco use: No . Alcohol use:  No . Other substance use:  No  Current Medications:  . None  . Reviewed and appropriate in pregnancy.   Genetic and Infection Screen: . Flow Sheet Updated Yes  Prenatal Exam: Gen: Well nourished, well developed.  No distress.  Vitals noted. HEENT: Normocephalic, atraumatic.  Neck supple. Lungs: Normal respiratory effort without distress Abd: Uterus not appreciated above pelvis. GU: Normal external female genitalia without lesions.  Nl vaginal, well rugated without lesions. White, frothy vaginal discharge.  Bimanual exam: No adnexal mass or TTP. No CMT.  Ext: No clubbing, cyanosis or edema. Psych: Normal grooming and dress.  Not depressed or anxious appearing.  Normal thought content and process without flight of ideas or  looseness of associations  Fetal heart tones: Not detected, dating ultrasound has been ordered.   Assessment/Plan:  Natalie Mason is a 20 y.o. [id number] at Unknown who presents to initiate prenatal care. She is doing well.  Current pregnancy issues include desire for waterbirth.  1. Routine prenatal care: Marland Kitchen As dating is not reliable, a dating ultrasound has been ordered. Dating tab updated. . Pre-pregnancy weight updated. Expected weight gain this pregnancy is 28-40 pounds . Prenatal labs reviewed, notable for O positive, antibody negative, rubella immune. . Indications for referral to HROB were reviewed and the patient does not meet criteria for referral.  . Medication list reviewed and updated.  . Recommended patient see a  dentist for regular care.  . Bleeding and pain precautions reviewed. . Importance of prenatal vitamins reviewed.  . Genetic screening offered. Patient opted for: patient undecided, will address at future visit. . The patient does have an indication for aspirin therapy beginning at 12-16 weeks. Aspirin was  recommended today to begin at [redacted] weeks gestation.  . The patient will not be age 71 or over at time of delivery. Referral to genetic counseling was not offered today.  . The patient has the following risk factors for preexisting diabetes: Reviewed indications for early 1 hour glucose testing, not indicated . An early 1 hour glucose tolerance test was not ordered. . Pregnancy Medical Home and PHQ-9 forms completed, problems noted: No  2. Pregnancy issues include the following which were addressed today:  . History of gestational hypertension: ASA 81 mg prescribed and advised to take once [redacted] weeks gestation  . Desires waterbirth: Information and referral to [facility] provided to patient today. Discussed that we would not be able to continue her prenatal care here. After knowing this, she seemed unsure about pursuing waterbirth. Discussed with patient to take some time to think about it and let us know her decision. She would need follow up in 4 weeks either here in [facility] clinic if she does not desire waterbirth or [facility] if she does.  . Difficulty with prenatal pills: Rx prenatal gummy's to take. Discussed how these don't typically have iron. May need to readdress in the future.  . Bacterial Vaginosis: wet prep with clue cells and positive whiff, will prescribe Metronidazole 500 mg BID x 7 days  . Pap smear with STI testing performed today. Will f/u results.   Follow up 4 weeks for next prenatal visit- undetermined if in [facility] or [facility] at this time.

## 2020-07-09 ENCOUNTER — Other Ambulatory Visit: Payer: Self-pay

## 2020-07-09 ENCOUNTER — Ambulatory Visit (INDEPENDENT_AMBULATORY_CARE_PROVIDER_SITE_OTHER): Payer: Medicaid Other | Admitting: Family Medicine

## 2020-07-09 ENCOUNTER — Other Ambulatory Visit (HOSPITAL_COMMUNITY)
Admission: RE | Admit: 2020-07-09 | Discharge: 2020-07-09 | Disposition: A | Discharge status: Home / Self Care | Payer: Medicaid Other | Source: Ambulatory Visit | Attending: Family Medicine | Admitting: Family Medicine

## 2020-07-09 VITALS — BP 108/60 | HR 74 | Wt 106.0 lb

## 2020-07-09 DIAGNOSIS — Z3490 Encounter for supervision of normal pregnancy, unspecified, unspecified trimester: Secondary | ICD-10-CM

## 2020-07-09 DIAGNOSIS — Z8759 Personal history of other complications of pregnancy, childbirth and the puerperium: Secondary | ICD-10-CM | POA: Diagnosis not present

## 2020-07-09 DIAGNOSIS — O23599 Infection of other part of genital tract in pregnancy, unspecified trimester: Secondary | ICD-10-CM | POA: Diagnosis not present

## 2020-07-09 DIAGNOSIS — Z3481 Encounter for supervision of other normal pregnancy, first trimester: Secondary | ICD-10-CM | POA: Diagnosis not present

## 2020-07-09 DIAGNOSIS — Z3491 Encounter for supervision of normal pregnancy, unspecified, first trimester: Secondary | ICD-10-CM | POA: Diagnosis not present

## 2020-07-09 DIAGNOSIS — B9689 Other specified bacterial agents as the cause of diseases classified elsewhere: Secondary | ICD-10-CM | POA: Insufficient documentation

## 2020-07-09 HISTORY — DX: Encounter for supervision of normal pregnancy, unspecified, unspecified trimester: Z34.90

## 2020-07-09 LAB — POCT WET PREP (WET MOUNT)
Clue Cells Wet Prep Whiff POC: POSITIVE
Trichomonas Wet Prep HPF POC: ABSENT

## 2020-07-09 MED ORDER — ASPIRIN EC 81 MG PO TBEC: 81.0000 mg | DELAYED_RELEASE_TABLET | Freq: Every day | ORAL | 11 refills | Status: DC

## 2020-07-09 MED ORDER — PRENATAL GUMMIES/DHA & FA 0.4-32.5 MG PO CHEW: 1.0000 | CHEWABLE_TABLET | Freq: Every day | ORAL | 3 refills | Status: DC

## 2020-07-09 MED ORDER — METRONIDAZOLE 500 MG PO TABS: 500.0000 mg | ORAL_TABLET | Freq: Two times a day (BID) | ORAL | 0 refills | Status: DC

## 2020-07-09 NOTE — Assessment & Plan Note (Signed)
Will treat with Metronidazole 500 mg BID x 7 days.

## 2020-07-09 NOTE — Patient Instructions (Addendum)
It was wonderful to see you today.  Please bring ALL of your medications with you to every visit.   You have been scheduled for your dating ultrasound.   I am sending a referral to [facility] for you to continue your prenatal care there. They will be able to assist with a waterbirth.   [facility] Address  [address] [phone]  Call to schedule an appointment in 4 weeks if you do not hear from them.   Today we did a Pap smear and screening for bacterial vaginosis, Chlamydia and Gonorrhea. I will let you know the results of that.  Continue to take prenatal vitamins daily. I sent in a prescription for gummy's.   If you experience any vaginal bleeding, leakage of fluids please report to the Maternal Assessment Unit at [facility] for evaluation.   I recommend you take 81 mg of Aspirin daily starting at 12 weeks to help prevent preeclampsia given your history of gestational hypertension.  Thank you for choosing [facility].   Please call [phone] with any questions about today's appointment.  Please be sure to schedule follow up at the front  desk before you leave today.   Sabino Dick, DO PGY-1 Family Medicine

## 2020-07-10 LAB — CERVICOVAGINAL ANCILLARY ONLY
Chlamydia: NEGATIVE
Comment: NEGATIVE
Comment: NORMAL
Neisseria Gonorrhea: NEGATIVE

## 2020-07-12 LAB — CYTOLOGY - PAP
Comment: NEGATIVE
Diagnosis: UNDETERMINED — AB
High risk HPV: POSITIVE — AB

## 2020-07-15 ENCOUNTER — Other Ambulatory Visit: Payer: Self-pay

## 2020-07-15 ENCOUNTER — Ambulatory Visit
Admission: RE | Admit: 2020-07-15 | Discharge: 2020-07-15 | Disposition: A | Discharge status: Home / Self Care | Payer: Medicaid Other | Source: Ambulatory Visit | Attending: Family Medicine | Admitting: Family Medicine

## 2020-07-15 DIAGNOSIS — Z3491 Encounter for supervision of normal pregnancy, unspecified, first trimester: Secondary | ICD-10-CM | POA: Insufficient documentation

## 2020-07-15 DIAGNOSIS — Z3A01 Less than 8 weeks gestation of pregnancy: Secondary | ICD-10-CM | POA: Diagnosis not present

## 2020-07-15 DIAGNOSIS — O26841 Uterine size-date discrepancy, first trimester: Secondary | ICD-10-CM | POA: Diagnosis not present

## 2020-07-28 NOTE — Progress Notes (Addendum)
    SUBJECTIVE:   CHIEF COMPLAINT / HPI:   Routine OB Follow Up Patient is [redacted]w[redacted]d, dated by early Ultrasound at [redacted]w[redacted]d. Patient reports daily nausea but no vomiting. Has not been taking prenatal vitamin as she noted insurance issues. Has not picked up Metronidazole for BV. 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 Previous pregnancy complicated by gestational hypertension. Desires breastfeeding. Considering POPs for birth control post-partum. Would like circumcision if boy. Denies vaginal bleeding, leakage of fluids, abdominal pain. Desires genetic screening.   PERTINENT  PMH / PSH:  Past Medical History:  Diagnosis Date   Pregnancy induced hypertension     OBJECTIVE:   BP (!) 100/50   Pulse 85   Wt 106 lb (48.1 kg)   LMP 04/22/2020   BMI 18.19 kg/m    General: NAD, pleasant, able to participate in exam Cardiac: RRR, no murmurs. Respiratory: CTAB, normal effort, No wheezes, rales or rhonchi Abdomen: Bowel sounds present, nontender, uterus below level ofumbilicus Extremities: no edema or cyanosis. Skin: warm and dry, no rashes noted Neuro: alert, no obvious focal deficits Psych: Normal affect and mood  ASSESSMENT/PLAN:   Supervision of normal pregnancy Experiencing daily nausea but still able to keep down PO. Will Rx Vitamin B6 to take. Also discussed methods to decrease nausea. Discussed importance of prenatal vitamins- patient prefers gummies as she has a hard time with pills. Given this, also changed Metronidazole to gel formula to complete 5 day course for BV. Desires genetic screening which can be ordered at follow up.  Anticipatory Guidance and Prenatal Education provided on the following topics:  - Preterm labor signs  - Reasons to present to MAU - Nutrition in pregnancy - Contraception postpartum - Breastfeed     Sabino Dick, DO [facility]

## 2020-07-28 NOTE — Patient Instructions (Addendum)
It was wonderful to see you today.  Please bring ALL of your medications with you to every visit.   Today we talked about:  Today you are 8 weeks and 6 days! Your due date is 03/04/21. Your next visit will be on 7/22.  Please take the Metronidazole for your bacterial vaginosis. I am also sending a prescription for Vitamin B6 to help with nausea. If this does not help, please let me know. I have resent a prescription for Prenatal gummies.   Visit [email or web address] for prenatal classes.  If you experience any vaginal bleeding, leakage of fluids, don't feel your baby moving as much, or start to have contractions less than 5 minutes apart please go directly to the Maternal Assessment Unit at [facility] for evaluation.  Maternity and women's care services located on the [place] side of [facility] [place]. [facility] (Entrance [address] off [address]).  [address] [place],  [place]  [address]     Thank you for choosing [facility].   Please call [phone] with any questions about today's appointment.  Please be sure to schedule follow up at the front  desk before you leave today.   Sabino Dick, DO PGY-1 Family Medicine

## 2020-07-29 ENCOUNTER — Other Ambulatory Visit: Payer: Self-pay

## 2020-07-29 ENCOUNTER — Ambulatory Visit: Payer: Medicaid Other | Admitting: Family Medicine

## 2020-07-29 DIAGNOSIS — Z3481 Encounter for supervision of other normal pregnancy, first trimester: Secondary | ICD-10-CM

## 2020-07-29 MED ORDER — VITAMIN B-6 25 MG PO TABS: 25.0000 mg | ORAL_TABLET | Freq: Every day | ORAL | 2 refills | Status: DC

## 2020-07-29 MED ORDER — METRONIDAZOLE 0.75 % VA GEL: 1.0000 | Freq: Every day | VAGINAL | 0 refills | Status: AC

## 2020-07-29 MED ORDER — CVS PRENATAL GUMMY 0.4 MG PO CHEW: 1.0000 | CHEWABLE_TABLET | Freq: Every day | ORAL | 1 refills | Status: DC

## 2020-07-29 NOTE — Assessment & Plan Note (Signed)
Experiencing daily nausea but still able to keep down PO. Will Rx Vitamin B6 to take. Also discussed methods to decrease nausea. Discussed importance of prenatal vitamins- patient prefers gummies as she has a hard time with pills. Given this, also changed Metronidazole to gel formula to complete 5 day course for BV. Desires genetic screening which can be ordered at follow up.  Anticipatory Guidance and Prenatal Education provided on the following topics:  - Preterm labor signs  - Reasons to present to MAU - Nutrition in pregnancy - Contraception postpartum - Breastfeed

## 2020-08-16 NOTE — Progress Notes (Deleted)
    SUBJECTIVE:   CHIEF COMPLAINT / HPI:   Routine OB Follow Up: [redacted]w[redacted]d Patient presents today to follow up prenatal care. She is dated by early ultrasound at [redacted]w[redacted]d. She reports no vaginal bleeding or pelvic pain. Taking prenatal vitamins***. Reports no complaints***. Nausea?***  PERTINENT  PMH / PSH:  Past Medical History:  Diagnosis Date   Pregnancy induced hypertension     OBJECTIVE:   LMP 04/22/2020    General: NAD, pleasant, able to participate in exam Cardiac: RRR, no murmurs. Respiratory: CTAB, normal effort, No wheezes, rales or rhonchi Abdomen: Bowel sounds present, nontender, nondistended, no hepatosplenomegaly. Extremities: no edema or cyanosis. Skin: warm and dry, no rashes noted Neuro: alert, no obvious focal deficits Psych: Normal affect and mood  ASSESSMENT/PLAN:   No problem-specific Assessment & Plan notes found for this encounter.   Pregnancy issues include: History of gestational HTN: Recommended to take 81 mg ASA daily. Return precautions given.  +HPV and ASC-US. Will need *** Desires genetic screening: *** ordered   Anticipatory Guidance and Prenatal Education provided on the following topics: - Preterm labor signs - Reasons to present to MAU - Nutrition in pregnancy - Expected weight gain in pregnancy - Contraception postpartum - Breastfeeding  Sabino Dick, DO [facility]

## 2020-08-23 ENCOUNTER — Encounter: Payer: Medicaid Other | Admitting: Family Medicine

## 2020-08-26 ENCOUNTER — Other Ambulatory Visit: Payer: Self-pay

## 2020-08-26 ENCOUNTER — Ambulatory Visit (INDEPENDENT_AMBULATORY_CARE_PROVIDER_SITE_OTHER): Payer: Medicaid Other | Admitting: Family Medicine

## 2020-08-26 VITALS — BP 100/60 | HR 96 | Wt 108.2 lb

## 2020-08-26 DIAGNOSIS — Z3402 Encounter for supervision of normal first pregnancy, second trimester: Secondary | ICD-10-CM

## 2020-08-26 DIAGNOSIS — Z3481 Encounter for supervision of other normal pregnancy, first trimester: Secondary | ICD-10-CM

## 2020-08-26 MED ORDER — ASPIRIN EC 81 MG PO TBEC
81.0000 mg | DELAYED_RELEASE_TABLET | Freq: Every day | ORAL | 11 refills | Status: DC
Start: 2020-08-26 — End: 2020-10-04

## 2020-08-26 NOTE — Patient Instructions (Addendum)
It was great seeing you today!  I am glad you are doing well! Please take a prenatal vitamin and aspirin daily. I have sent in the aspirin again since you did not get it the first time.   Please follow up at your next scheduled appointment in 4 weeks for your next OB follow up, if anything arises between now and then, please don't hesitate to contact our office.   Thank you for allowing [place] to be a part of your medical care!  Thank you, Dr. Robyne Peers   Go to the MAU at [facility] at [facility] if: You have pain in your lower abdomen or pelvic area Your water breaks.  Sometimes it is a big gush of fluid, sometimes it is just a trickle that keeps getting your underwear wet or running down your legs You have vaginal bleeding.

## 2020-08-26 NOTE — Progress Notes (Addendum)
  Patient Name: Arleen Bar Date of Birth: 05-Nov-2000 [facility] Prenatal Visit  Deriona Idy Rawling is a 20 y.o. [phone] at [redacted]w[redacted]d here for routine follow up. She is dated by early ultrasound.  She reports no complaints.  She denies vaginal bleeding.  See flow sheet for details.  Vitals:   08/26/20 1621  BP: 100/60  Pulse: 96     A/P: Pregnancy at [redacted]w[redacted]d.  Doing well.    Routine Prenatal Care:  Dating reviewed, dating tab is correct. Fetal heart tones: appropriate (138) Influenza vaccine: not previously administered  COVID vaccination was discussed and patient politely declined stating that she would like to get it later during her pregnancy course.  The patient has the following indication for screening preexisting diabetes: none. Anatomy ultrasound ordered to be scheduled at 18-20 weeks. Pregnancy education including expected weight gain in pregnancy, OTC medication use, continued use of prenatal vitamin, smoking cessation if applicable, and nutrition in pregnancy.   Bleeding and pain precautions reviewed.  2. Pregnancy issues include the following and were addressed as appropriate today:  Encouraged patient to take prenatal vitamin and aspirin daily due given history of gestational hypertension.  Problem list  and pregnancy box updated: Yes.   Follow up 4 weeks. Encourage administration of COVID vaccination at this time.

## 2020-10-03 NOTE — Progress Notes (Signed)
  Patient Name: [name] Date of Birth: 03-04-2000 [facility] Prenatal Visit  Natalie Mason is a 20 y.o. [id number] at [redacted]w[redacted]d here for routine follow up. She is dated by early ultrasound at [redacted]w[redacted]d.  She reports no complaints.  She denies vaginal bleeding.  See flow sheet for details.  Vitals:   10/04/20 1029  BP: (!) 100/53  Pulse: 83     A/P: Pregnancy at [redacted]w[redacted]d.  Doing well.    Routine Prenatal Care:  Dating reviewed, dating tab is correct Fetal heart tones Appropriate at 160  Influenza vaccine not administered as not influenza season.   COVID vaccination was discussed and patient would like to wait until further along.  The patient has the following indication for screening preexisting diabetes: Reviewed indications for early 1 hour glucose testing, not indicated . Anatomy ultrasound ordered to be scheduled at 18-20 weeks. Patient is interested in genetic screening. As she is past 13 weeks and 6 days, Cell-free DNA was offered.  Pregnancy education including expected weight gain in pregnancy, OTC medication use, continued use of prenatal vitamin, smoking cessation if applicable, and nutrition in pregnancy.   Bleeding and pain precautions reviewed.  2. Pregnancy issues include the following and were addressed as appropriate today:  History of gestational hypertension: Continue aspirin 81 mg daily (refill placed), BP normotensive today. Patient opted for cell-free DNA testing. Forms were filled out today but patient would like to wait until next visit in October until she gets the blood test because she states she hasn't had anything to eat or drink yet and may pass out.  Anatomy ultrasound scheduled today for next Friday, September 9th.  Problem list  and pregnancy box updated: Yes.   Follow up 4 weeks with me. No appointments available in [facility].

## 2020-10-03 NOTE — Patient Instructions (Addendum)
It was wonderful to see you today.  Please bring ALL of your medications with you to every visit.  This will occur next Friday, September 9th. The location is [address]. [place], [place].   Today we talked about:  -Today we ordered your Anatomy Ultrasound.  -Continue to take daily your Aspirin and prenatal vitamin daily.  -We filled out a form to do your genetic screening. This can be done at your next appointment.  -I will see you for your next appointment on Oct 4th at 2:10 PM.  If you experience any vaginal bleeding, leakage of fluids, don't feel your baby moving as much, or start to have contractions less than 5 minutes apart please go directly to the Maternal Assessment Unit at [facility] for evaluation.  Maternity and women's care services located on the [place] side of [facility] [place]. [facility] (Entrance C off [address]).  [address] Entrance C [place],  [place]  [address]     Thank you for choosing [facility].   Please call [phone] with any questions about today's appointment.  Please be sure to schedule follow up at the front  desk before you leave today.   Sabino Dick, DO PGY-2 Family Medicine

## 2020-10-04 ENCOUNTER — Other Ambulatory Visit: Payer: Self-pay

## 2020-10-04 ENCOUNTER — Ambulatory Visit (INDEPENDENT_AMBULATORY_CARE_PROVIDER_SITE_OTHER): Payer: Medicaid Other | Admitting: Family Medicine

## 2020-10-04 VITALS — BP 100/53 | HR 83 | Wt 112.0 lb

## 2020-10-04 DIAGNOSIS — Z3482 Encounter for supervision of other normal pregnancy, second trimester: Secondary | ICD-10-CM

## 2020-10-04 DIAGNOSIS — Z3481 Encounter for supervision of other normal pregnancy, first trimester: Secondary | ICD-10-CM

## 2020-10-04 MED ORDER — ASPIRIN EC 81 MG PO TBEC: 81.0000 mg | DELAYED_RELEASE_TABLET | Freq: Every day | ORAL | 11 refills | Status: DC

## 2020-10-11 ENCOUNTER — Other Ambulatory Visit: Payer: Self-pay

## 2020-10-11 ENCOUNTER — Ambulatory Visit: Payer: Medicaid Other | Attending: Family Medicine

## 2020-10-11 DIAGNOSIS — Z3482 Encounter for supervision of other normal pregnancy, second trimester: Secondary | ICD-10-CM | POA: Insufficient documentation

## 2020-11-04 NOTE — Progress Notes (Signed)
  [facility] Prenatal Visit  Natalie Mason is a 20 y.o. [phone] at [redacted]w[redacted]d here for routine follow up. She is dated by early ultrasound.  She reports no complaints.  She reports good fetal movement. No bleeding, loss of fluid, contractions. See flow sheet for details. Vitals:   11/05/20 1410  BP: (!) 88/70  Pulse: 85    A/P: Pregnancy at [redacted]w[redacted]d.  Doing well.   Dating reviewed, dating tab is correct Fetal heart tones Appropriate Fundal height within expected range.  Anatomy ultrasound reviewed and notable for normal female.  Influenza vaccine not administered as patient declined, will continue to discuss. Marland Kitchen  COVID vaccination was discussed and would like to get at next appointment in 4 weeks.  Indications for screening for preexisting diabetes include: Reviewed indications for early 1 hour glucose testing, not indicated .  Pregnancy education provided on the following topics: fetal growth and movement, ultrasound assessment, and upcoming laboratory assessment.   Scheduled for [facility] during second trimester on 11/3. Preterm labor precautions given.   2. Pregnancy issues include the following and were addressed as appropriate today:  History of gestational HTN. She continues on ASA 81 mg daily. BP hypotensive today but patient asymptomatic.  Advised to take prenatals and 81 mg of ASA daily.  Previously wanted cell-free DNA. Form completed previously but she declines blood test today. Problem list and pregnancy box updated: Yes.   Follow up 4 weeks with [facility].

## 2020-11-04 NOTE — Patient Instructions (Signed)
It was wonderful to see you today.  Please bring ALL of your medications with you to every visit.   Today we talked about:  -Your baby sounds great and is growing as expected! -Continue to take the Aspirin daily along with prenatals.  -Your next appointment will be in 4 weeks, though return next week for a glucose test to check for gestational diabetes.   If you experience any vaginal bleeding, leakage of fluids, don't feel your baby moving as much, or start to have contractions less than 5 minutes apart please go directly to the Maternal Assessment Unit at [facility] for evaluation.  Maternity and women's care services located on the [place] side of [facility] [place]. [facility] (Entrance C off [address]  [address] Entrance [address] [place],  [place]  [address]     Thank you for choosing [facility].   Please call [phone] with any questions about today's appointment.  Please be sure to schedule follow up at the front  desk before you leave today.   Sabino Dick, DO PGY-2 Family Medicine

## 2020-11-05 ENCOUNTER — Other Ambulatory Visit: Payer: Self-pay

## 2020-11-05 ENCOUNTER — Ambulatory Visit (INDEPENDENT_AMBULATORY_CARE_PROVIDER_SITE_OTHER): Payer: Medicaid Other | Admitting: Family Medicine

## 2020-11-05 VITALS — BP 88/70 | HR 85 | Wt 120.0 lb

## 2020-11-05 DIAGNOSIS — Z3482 Encounter for supervision of other normal pregnancy, second trimester: Secondary | ICD-10-CM

## 2020-11-15 ENCOUNTER — Other Ambulatory Visit: Payer: Medicaid Other

## 2020-11-15 ENCOUNTER — Other Ambulatory Visit: Payer: Self-pay

## 2020-11-15 DIAGNOSIS — Z3482 Encounter for supervision of other normal pregnancy, second trimester: Secondary | ICD-10-CM | POA: Diagnosis not present

## 2020-11-16 LAB — GLUCOSE TOLERANCE, 1 HOUR: Glucose, 1Hr PP: 125 mg/dL (ref 70–199)

## 2020-12-05 ENCOUNTER — Other Ambulatory Visit: Payer: Self-pay

## 2020-12-05 ENCOUNTER — Ambulatory Visit (INDEPENDENT_AMBULATORY_CARE_PROVIDER_SITE_OTHER): Payer: Medicaid Other | Admitting: Family Medicine

## 2020-12-05 VITALS — BP 112/70 | HR 103 | Wt 124.8 lb

## 2020-12-05 DIAGNOSIS — Z23 Encounter for immunization: Secondary | ICD-10-CM

## 2020-12-05 DIAGNOSIS — Z3481 Encounter for supervision of other normal pregnancy, first trimester: Secondary | ICD-10-CM

## 2020-12-05 NOTE — Patient Instructions (Addendum)
It was so nice to meet you today!  - We gave you your Tdap vaccine today - Please come in next week or so to do your one hour sugar test and other blood work, anytime between 830-10 am or 1:30-300 PM when our lab is open, you do not need to be fasting - You can do the cell free DNA test at this lab appt as well - Continue taking your aspirin 81 mg daily - Please schedule a follow up visit in 2 weeks    Pregnancy Related Return Precautions The follow are signs/symptoms that are abnormal in pregnancy and may require further evaluation by a physician: Go to the MAU at [facility] at [facility] if: You have cramping/contractions that do not go away with drinking water, especially if they are lasting 30 seconds to 1.5 minutes, coming and going every 5-10 minutes for an hour or more, or are getting stronger and you cannot walk or talk while having a contraction/cramp. Your water breaks.  Sometimes it is a big gush of fluid, sometimes it is just a trickle that keeps getting your underwear wet or running down your legs You have vaginal bleeding.    You do not feel your baby moving like normal.  If you do not, get something to eat and drink (something cold or something with sugar like peanut butter or juice) and lay down and focus on feeling your baby move. If your baby is still not moving like normal, you should go to MAU. You should feel your baby move 6 times in one hour, or 10 times in two hours. You have a persistent headache that does not go away with 1 g of Tylenol, vision changes, chest pain, difficulty breathing, severe pain in your right upper abdomen, worsening leg swelling- these can all be signs of high blood pressure in pregnancy and need to be evaluated by a provider immediately  These are all concerning in pregnancy and if you have any of these I recommend you call your PCP and present to the Maternity Admissions Unit (map below) for further evaluation.  For any  pregnancy-related emergencies, please go to the Maternity Admissions Unit in the Women's & Children's Center at [facility]. You will use hospital Entrance C.

## 2020-12-05 NOTE — Progress Notes (Signed)
[facility] Visit  Natalie Mason is a 20 y.o. [phone] at [redacted]w[redacted]d (via [redacted]w[redacted]d ultrasound) who presents to [facility] for routine follow up. Prenatal course, history, notes, ultrasounds, and laboratory results reviewed.  Denies cramping/ctx, fluid leaking, vaginal bleeding, or decreased fetal movement. Taking PNV and aspirin.    Primary Prenatal Care Provider: Dr Melba Coon  Postpartum Plans: - delivery planning: SVD, no epidural - circumcision: yes - feeding: breast - pediatrician: [facility] - contraception: POPs  FHR: 150 Uterine size: 25 cm  Assessment & Plan  1. Routine prenatal care: - CBC, HIV, RPR, and 1 hr GTT due, patient to come in the next week or to be DONE NEXT VISIT - Tdap vaccination given today - Declined influenza, and COVID vaccinations today, counseled patient - Had previously wanted cell free DNA for genetic screening, she declines today and does not want anymore - PCN allergy, when GBS testing done if positive will need susceptibilities (she has had previous pregnancy with GBS bacteruria but none this pregnancy)  2. History of gestational HTN- continue aspirin 81 mg daily. Discussed signs and symptoms of pre-eclampsia today.  3. Abnormal pap smear ASCUS + HPV - needs repeat pap June 2023.  Next prenatal visit in 2 weeks . Labor & fetal movement precautions discussed.  Burley Saver, MD [facility]

## 2020-12-06 ENCOUNTER — Encounter: Payer: Medicaid Other | Admitting: Family Medicine

## 2020-12-20 ENCOUNTER — Encounter: Payer: Medicaid Other | Admitting: Family Medicine

## 2020-12-23 NOTE — Progress Notes (Addendum)
Natalie Mason is a 20 y.o. [phone] at [redacted]w[redacted]d for routine follow up.  She reports continues to be nauseated and has not been responsive to diclegis nor Zofran in the past.    PE General: female appearing stated age in no acute distress Cardio: Normal S1 and S2, no S3 or S4. Rhythm is regular. No murmurs or rubs.  Bilateral radial pulses palpable Pulm: Clear to auscultation bilaterally, no crackles, wheezing, or diminished breath sounds. Normal respiratory effort, stable on RA Abdomen: Bowel sounds normal. Abdomen soft and non-tender.  Extremities: No peripheral edema. Warm/ well perfused.    A/P: Pregnancy at [redacted]w[redacted]d.  Doing well.   Pregnancy issues include nausea, hx of   Infant feeding choice: BF  Contraception choice: POPS  Infant circumcision: desired yes  Tdap was not given today. 1 hour glucola, CBC, RPR, and HIV were done today.   Pregnancy medical home forms were not done today and reviewed.   RH status was reviewed and pt does not need Rhogam.   Rhogam was not given today.   Patient declines influenza vaccine.   She reports she has not been taking the ASA regularly  She has not been taking prenatal vitamins regularly Encouraged patient to take both PNV and ASA daily   Childbirth and education classes were offered due to scheduling concerns for patients. Preterm labor precautions reviewed. Kick counts reviewed.  Follow up 2 weeks.

## 2020-12-24 ENCOUNTER — Other Ambulatory Visit: Payer: Self-pay

## 2020-12-24 ENCOUNTER — Ambulatory Visit (INDEPENDENT_AMBULATORY_CARE_PROVIDER_SITE_OTHER): Payer: Medicaid Other | Admitting: Family Medicine

## 2020-12-24 VITALS — BP 103/58 | HR 90 | Wt 124.8 lb

## 2020-12-24 DIAGNOSIS — Z3493 Encounter for supervision of normal pregnancy, unspecified, third trimester: Secondary | ICD-10-CM | POA: Diagnosis not present

## 2020-12-24 DIAGNOSIS — Z3481 Encounter for supervision of other normal pregnancy, first trimester: Secondary | ICD-10-CM

## 2020-12-24 NOTE — Patient Instructions (Addendum)
Please take your prenatal vitamin and aspirin on daily basis.   Third Trimester of Pregnancy The third trimester of pregnancy is from week 28 through week 40. This is also called months 7 through 9. This trimester is when your unborn baby (fetus) is growing very fast. At the end of the ninth month, the unborn baby is about 20 inches long. It weighs about 6-10 pounds. Body changes during your third trimester Your body continues to go through many changes during this time. The changes vary and generally return to normal after the baby is born. Physical changes Your weight will continue to increase. You may gain 25-35 pounds (11-16 kg) by the end of the pregnancy. If you are underweight, you may gain 28-40 lb (about 13-18 kg). If you are overweight, you may gain 15-25 lb (about 7-11 kg). You may start to get stretch marks on your hips, belly (abdomen), and breasts. Your breasts will continue to grow and may hurt. A yellow fluid (colostrum) may leak from your breasts. This is the first milk you are making for your baby. You may have changes in your hair. Your belly button may stick out. You may have more swelling in your hands, face, or ankles. Health changes You may have heartburn. You may have trouble pooping (constipation). You may get hemorrhoids. These are swollen veins in the butt that can itch or get painful. You may have swollen veins (varicose veins) in your legs. You may have more body aches in the pelvis, back, or thighs. You may have more tingling or numbness in your hands, arms, and legs. The skin on your belly may also feel numb. You may feel short of breath as your womb (uterus) gets bigger. Other changes You may pee (urinate) more often. You may have more problems sleeping. You may notice the unborn baby "dropping," or moving lower in your belly. You may have more discharge coming from your vagina. Your joints may feel loose, and you may have pain around your pelvic  bone. Follow these instructions at home: Medicines Take over-the-counter and prescription medicines only as told by your doctor. Some medicines are not safe during pregnancy. Take a prenatal vitamin that contains at least 600 micrograms (mcg) of folic acid. Eating and drinking Eat healthy meals that include: Fresh fruits and vegetables. Whole grains. Good sources of protein, such as meat, eggs, or tofu. Low-fat dairy products. Avoid raw meat and unpasteurized juice, milk, and cheese. These carry germs that can harm you and your baby. Eat 4 or 5 small meals rather than 3 large meals a day. You may need to take these actions to prevent or treat trouble pooping: Drink enough fluids to keep your pee (urine) pale yellow. Eat foods that are high in fiber. These include beans, whole grains, and fresh fruits and vegetables. Limit foods that are high in fat and sugar. These include fried or sweet foods. Activity Exercise only as told by your doctor. Stop exercising if you start to have cramps in your womb. Avoid heavy lifting. Do not exercise if it is too hot or too humid, or if you are in a place of great height (high altitude). If you choose to, you may have sex unless your doctor tells you not to. Relieving pain and discomfort Take breaks often, and rest with your legs raised (elevated) if you have leg cramps or low back pain. Take warm water baths (sitz baths) to soothe pain or discomfort caused by hemorrhoids. Use hemorrhoid cream if your  doctor approves. Wear a good support bra if your breasts are tender. If you develop bulging, swollen veins in your legs: Wear support hose as told by your doctor. Raise your feet for 15 minutes, 3-4 times a day. Limit salt in your food. Safety Talk to your doctor before traveling far distances. Do not use hot tubs, steam rooms, or saunas. Wear your seat belt at all times when you are in a car. Talk with your doctor if someone is hurting you or yelling  at you a lot. Preparing for your baby's arrival To prepare for the arrival of your baby: Take prenatal classes. Visit the hospital and tour the maternity area. Buy a rear-facing car seat. Learn how to install it in your car. Prepare the baby's room. Take out all pillows and stuffed animals from the baby's crib. General instructions Avoid cat litter boxes and soil used by cats. These carry germs that can cause harm to the baby and can cause a loss of your baby by miscarriage or stillbirth. Do not douche or use tampons. Do not use scented sanitary pads. Do not smoke or use any products that contain nicotine or tobacco. If you need help quitting, ask your doctor. Do not drink alcohol. Do not use herbal medicines, illegal drugs, or medicines that were not approved by your doctor. Chemicals in these products can affect your baby. Keep all follow-up visits. This is important. Where to find more information American Pregnancy Association: americanpregnancy.org [employer]: www.acog.org Office on Women's Health: [employer].nz Contact a doctor if: You have a fever. You have mild cramps or pressure in your lower belly. You have a nagging pain in your belly area. You vomit, or you have watery poop (diarrhea). You have bad-smelling fluid coming from your vagina. You have pain when you pee, or your pee smells bad. You have a headache that does not go away when you take medicine. You have changes in how you see, or you see spots in front of your eyes. Get help right away if: Your water breaks. You have regular contractions that are less than 5 minutes apart. You are spotting or bleeding from your vagina. You have very bad belly cramps or pain. You have trouble breathing. You have chest pain. You faint. You have not felt the baby move for the amount of time told by your doctor. You have new or increased pain, swelling, or redness in an arm or  leg. Summary The third trimester is from week 28 through week 40 (months 7 through 9). This is the time when your unborn baby is growing very fast. During this time, your discomfort may increase as you gain weight and as your baby grows. Get ready for your baby to arrive by taking prenatal classes, buying a rear-facing car seat, and preparing the baby's room. Get help right away if you are bleeding from your vagina, you have chest pain and trouble breathing, or you have not felt the baby move for the amount of time told by your doctor. This information is not intended to replace advice given to you by your health care provider. Make sure you discuss any questions you have with your health care provider. Document Revised: 06/28/2019 Document Reviewed: 05/04/2019 Elsevier Patient Education  2022 [employer].

## 2020-12-25 LAB — CBC
Hematocrit: 29.8 % — ABNORMAL LOW (ref 34.0–46.6)
Hemoglobin: 9.6 g/dL — ABNORMAL LOW (ref 11.1–15.9)
MCH: 28.7 pg (ref 26.6–33.0)
MCHC: 32.2 g/dL (ref 31.5–35.7)
MCV: 89 fL (ref 79–97)
Platelets: 287 10*3/uL (ref 150–450)
RBC: 3.35 x10E6/uL — ABNORMAL LOW (ref 3.77–5.28)
RDW: 13.1 % (ref 11.7–15.4)
WBC: 7.7 10*3/uL (ref 3.4–10.8)

## 2020-12-25 LAB — RPR: RPR Ser Ql: NONREACTIVE

## 2020-12-25 LAB — GLUCOSE TOLERANCE, 1 HOUR: Glucose, 1Hr PP: 90 mg/dL (ref 70–199)

## 2020-12-25 LAB — HIV ANTIBODY (ROUTINE TESTING W REFLEX): HIV Screen 4th Generation wRfx: NONREACTIVE

## 2020-12-30 ENCOUNTER — Encounter: Payer: Medicaid Other | Admitting: Family Medicine

## 2021-01-08 ENCOUNTER — Ambulatory Visit (INDEPENDENT_AMBULATORY_CARE_PROVIDER_SITE_OTHER): Payer: Medicaid Other | Admitting: Family Medicine

## 2021-01-08 ENCOUNTER — Encounter: Payer: Self-pay | Admitting: Family Medicine

## 2021-01-08 ENCOUNTER — Other Ambulatory Visit: Payer: Self-pay

## 2021-01-08 DIAGNOSIS — Z3483 Encounter for supervision of other normal pregnancy, third trimester: Secondary | ICD-10-CM

## 2021-01-08 NOTE — Progress Notes (Signed)
  [facility] Prenatal Visit  Natalie Mason is a 20 y.o. [phone] at [redacted]w[redacted]d here for routine follow up. She is dated by early ultrasound.  She reports nausea.  She reports fetal movement. She denies vaginal bleeding, contractions, or loss of fluid.  See flow sheet for details.  Vitals:   01/08/21 1526  BP: (!) 102/53  Pulse: 75   General: female appearing stated age in no acute distress Cardio: Normal S1 and S2, no S3 or S4. Rhythm is regular. No murmurs or rubs.  Bilateral radial pulses palpable Pulm: Clear to auscultation bilaterally, no crackles, wheezing, or diminished breath sounds. Normal respiratory effort Abdomen: Bowel sounds normal. Abdomen soft and non-tender. Gravid uterus with fundus measuring within goal range  Extremities: No peripheral edema. Warm/ well perfused.   A/P: Pregnancy at [redacted]w[redacted]d.  Doing well.   Routine prenatal care:  Dating reviewed, dating tab is correct Fetal heart tones: Appropriate Fundal height: within expected range.  The patient does not have a history of HSV and valacyclovir is not indicated at this time.  Infant feeding choice: Breastfeeding Contraception choice: Pills  Infant circumcision desired yes Influenza vaccine not administered as patient declined, will continue to discuss.   Tdap was not given today. Previously completed.  COVID vaccination was discussed and patient declines, states she does not want injections for this.  Pregnancy education regarding benefits of breastfeeding, contraception, fetal growth, expected weight gain, and safe infant sleep were discussed.  Preterm labor and fetal movement precautions reviewed.   2. Pregnancy issues include the following and were addressed as appropriate today:  IDA in pregnancy: counseled patient on importance of adherence to prenatal vitamin daily in addition to supplemental iron  Problem list and pregnancy box updated: Yes.  Recommended to take ASA 81, patient reports not  taking this regularly due to preference, discussed why this is recommended for her   Follow up 2 weeks.

## 2021-01-08 NOTE — Patient Instructions (Signed)
Preterm Labor The normal length of a pregnancy is 39-41 weeks. Preterm labor is when labor starts before 37 completed weeks of pregnancy. Babies who are born prematurely and survive may not be fully developed and may be at an increased risk for long-term problems such as cerebral palsy, developmental delays, and vision andhearing problems. Babies who are born too early may have problems soon after birth. Premature babies may have problems regulating blood sugar, body temperature, heart rate, and breathing rate. These babies often have trouble with feeding. The risk ofhaving problems is highest for babies who are born before 34 weeks of pregnancy. What are the causes? The exact cause of this condition is not known. What increases the risk? You are more likely to have preterm labor if you have certain risk factors that relate to your medical history, problems with present and past pregnancies, andlifestyle factors. Medical history You have abnormalities of the uterus, including a short cervix. You have STIs (sexually transmitted infections) or other infections of the urinary tract and the vagina. You have chronic illnesses, such as blood clotting problems, diabetes, or high blood pressure. You are overweight or underweight. Present and past pregnancies You have had preterm labor before. You are pregnant with twins or other multiples. You have been diagnosed with a condition in which the placenta covers your cervix (placenta previa). You waited less than 18 months between giving birth and becoming pregnant again. Your unborn baby has some abnormalities. You have vaginal bleeding during pregnancy. You became pregnant through in vitro fertilization (IVF). Lifestyle and environmental factors You use tobacco products or drink alcohol. You use drugs. You have stress and no social support. You experience domestic violence. You are exposed to certain chemicals or environmental pollutants. Other  factors You are younger than age 17 or older than age 35. What are the signs or symptoms? Symptoms of this condition include: Cramps similar to those that can happen during a menstrual period. The cramps may happen with diarrhea. Pain in the abdomen or lower back. Regular contractions that may feel like tightening of the abdomen. A feeling of increased pressure in the pelvis. Increased watery or bloody mucus discharge from the vagina. Water breaking (ruptured amniotic sac). How is this diagnosed? This condition is diagnosed based on: Your medical history and a physical exam. A pelvic exam. An ultrasound. Monitoring your uterus for contractions. Other tests, including: A swab of the cervix to check for a chemical called fetal fibronectin. Urine tests. How is this treated? Treatment for this condition depends on the length of your pregnancy, your condition, and the health of your baby. Treatment may include: Taking medicines, such as: Hormone medicines. These may be given early in pregnancy to help support the pregnancy. Medicines to stop contractions. Medicines to help mature the baby's lungs. These may be prescribed if the risk of delivery is high. Medicines to help protect your baby from brain and nerve complications such as cerebral palsy. Bed rest. If the labor happens before 34 weeks of pregnancy, you may need to stay in the hospital. Delivery of the baby. Follow these instructions at home:  Do not use any products that contain nicotine or tobacco. These products include cigarettes, chewing tobacco, and vaping devices, such as e-cigarettes. If you need help quitting, ask your health care provider. Do not drink alcohol. Take over-the-counter and prescription medicines only as told by your health care provider. Rest as told by your health care provider. Return to your normal activities as told by your   health care provider. Ask your health care provider what activities are safe for  you. Keep all follow-up visits. This is important. How is this prevented? To increase your chance of having a full-term pregnancy: Do not use drugs or take medicines that have not been prescribed to you during your pregnancy. Talk with your health care provider before taking any herbal supplements, even if you have been taking them regularly. Make sure you gain a healthy amount of weight during your pregnancy. Watch for infection. If you think that you might have an infection, get it checked right away. Symptoms of infection may include: Fever. Abnormal vaginal discharge or discharge that smells bad. Pain or burning with urination. Needing to urinate urgently. Frequently urinating or passing small amounts of urine frequently. Blood in your urine or urine that smells bad or unusual. Where to find more information U.S. Department of Health and Human Services Office on Women's Health: www.womenshealth.gov The American College of Obstetricians and Gynecologists: www.acog.org Centers for Disease Control and Prevention, Preterm Birth: www.cdc.gov Contact a health care provider if: You think you are going into preterm labor. You have signs or symptoms of preterm labor. You have symptoms of infection. Get help right away if: You are having regular, painful contractions every 5 minutes or less. Your water breaks. Summary Preterm labor is labor that starts before you reach 37 weeks of pregnancy. Delivering your baby early increases your baby's risk of developing long-term problems. You are more likely to have preterm labor if you have certain risk factors that relate to your medical history, problems with present and past pregnancies, and lifestyle factors. Keep all follow-up visits. This is important. Contact a health care provider if you have signs or symptoms of preterm labor. This information is not intended to replace advice given to you by your health care provider. Make sure you discuss  any questions you have with your healthcare provider. Document Revised: 01/23/2020 Document Reviewed: 01/23/2020 Elsevier Patient Education  2022 Elsevier Inc.  

## 2021-02-02 NOTE — L&D Delivery Note (Signed)
Delivery Note Natalie Mason is a 21 y.o. [id number] at [redacted]w[redacted]d admitted for elective IOL.   GBS Status: Negative/-- (01/09 1401) Maximum Maternal Temperature: 99.2  Labor course: Initial SVE: 0.5/Thick/-2. Augmentation with: AROM, Pitocin, and IP Foley. She then progressed to complete.  ROM: 4h 53m with clear fluid  Birth: At 2017 a viable female was delivered via spontaneous vaginal delivery (Presentation: cephalic;LOA). Nuchal cord present, reduced and baby was somersaulted and delivered in usual fashion. Shoulders and body delivered in usual fashion. Infant placed directly on mom's abdomen for bonding/skin-to-skin, baby dried and stimulated. Cord clamped x 2 after 1 minute and cut by FOB. Cord blood collected. The placenta separated spontaneously and delivered via gentle cord traction. Pitocin infused rapidly IV per protocol. TXA was also given for prophylaxis given admission Hgb 8.8. Fundus firm with massage.  Placenta inspected and appears to be intact with a 3 VC.  Placenta/Cord with the following complications: none. Cord pH: n/a Sponge and instrument count were correct x2.  Intrapartum complications:  None Anesthesia:  none Episiotomy: none Lacerations:  none Suture Repair:  n/a EBL (mL): 100   Infant: APGAR (1 MIN): 9  APGAR (5 MINS): 9  Infant weight: pending  Mom to postpartum.  Baby to Couplet care / Skin to Skin. Placenta to L&D   Plans to Breastfeed Contraception: oral progesterone-only contraceptive Circumcision: wants inpatient  Note sent to [facility]:  [facility] for pp visit.   Brand Males CNM 03/01/2021 8:30 PM

## 2021-02-06 NOTE — Patient Instructions (Addendum)
It was wonderful to see you today.  Today we talked about:  -We are doing routine screening for sexually transmitted infections and testing for Group B strep. -Continue to take your prenatal vitamins, aspirin and iron-supplements. -Return in 1 week with me on 1/18 at 10:10AM.  If you experience any vaginal bleeding, leakage of fluids, don't feel your baby moving as much, or start to have contractions less than 5 minutes apart please go directly to the Maternal Assessment Unit at [facility] for evaluation.  Maternity and women's care services located on the [place] side of The [place]. [facility] (Entrance C off [address]).  [address] [place],  [place]  [address]      Thank you for choosing [place].   Please call [phone] with any questions about today's appointment.  Please be sure to schedule follow up at the front  desk before you leave today.   Sharion Settler, DO PGY-2 Family Medicine

## 2021-02-06 NOTE — Progress Notes (Signed)
°  [facility] Prenatal Visit  Natalie Mason is a 21 y.o. [phone] at [redacted]w[redacted]d here for routine follow up. She is dated by early ultrasound ([redacted]w[redacted]d).  She reports  "lightening crotch" . She reports fetal movement. She denies vaginal bleeding, contractions, or loss of fluid. See flow sheet for details.  Vitals:   02/10/21 1127  BP: 115/70  Pulse: 92    A/P: Pregnancy at [redacted]w[redacted]d.  Doing well.   Routine prenatal care  Dating reviewed, dating tab is correct Fetal heart tones Appropriate Fundal height within expected range.  Fetal position confirmed Vertex using Leopold's .  GBS collected today. .  Repeat GC/CT collected today.  The patient does not have a history of HSV and valacyclovir is not indicated at this time.  Infant feeding choice: Breastfeeding Contraception choice: Pills  Infant circumcision desired yes Influenza vaccine not administered as patient declined, will continue to discuss.   Tdap previously administered between 27-36 weeks  COVID vaccination was discussed and declines.  Pregnancy education regarding preterm labor, fetal movement,  benefits of breastfeeding, contraception, fetal growth, expected weight gain, and safe infant sleep were discussed.   2. Pregnancy issues include the following and were addressed as appropriate today:  Hx of Gestational HTN: on 81 mg ASA, has been intermittently adherent  Hx GBS bacteruria and PCN allergy. GBS collected today, susceptibilities will be needed. 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 CBC at follow up visit Problem list and pregnancy box updated: Yes.   Follow up 1 week.

## 2021-02-10 ENCOUNTER — Encounter: Payer: Self-pay | Admitting: Family Medicine

## 2021-02-10 ENCOUNTER — Ambulatory Visit (INDEPENDENT_AMBULATORY_CARE_PROVIDER_SITE_OTHER): Payer: Medicaid Other | Admitting: Family Medicine

## 2021-02-10 ENCOUNTER — Other Ambulatory Visit (HOSPITAL_COMMUNITY)
Admission: RE | Admit: 2021-02-10 | Discharge: 2021-02-10 | Disposition: A | Discharge status: Home / Self Care | Payer: Medicaid Other | Source: Ambulatory Visit | Attending: Family Medicine | Admitting: Family Medicine

## 2021-02-10 ENCOUNTER — Other Ambulatory Visit: Payer: Self-pay

## 2021-02-10 VITALS — BP 115/70 | HR 92 | Wt 134.5 lb

## 2021-02-10 DIAGNOSIS — Z3483 Encounter for supervision of other normal pregnancy, third trimester: Secondary | ICD-10-CM | POA: Insufficient documentation

## 2021-02-10 DIAGNOSIS — D500 Iron deficiency anemia secondary to blood loss (chronic): Secondary | ICD-10-CM

## 2021-02-10 DIAGNOSIS — R87810 Cervical high risk human papillomavirus (HPV) DNA test positive: Secondary | ICD-10-CM

## 2021-02-10 DIAGNOSIS — D509 Iron deficiency anemia, unspecified: Secondary | ICD-10-CM | POA: Insufficient documentation

## 2021-02-10 DIAGNOSIS — R87610 Atypical squamous cells of undetermined significance on cytologic smear of cervix (ASC-US): Secondary | ICD-10-CM | POA: Insufficient documentation

## 2021-02-10 HISTORY — DX: Atypical squamous cells of undetermined significance on cytologic smear of cervix (ASC-US): R87.610

## 2021-02-10 HISTORY — DX: Iron deficiency anemia, unspecified: D50.9

## 2021-02-10 LAB — OB RESULTS CONSOLE GC/CHLAMYDIA: Gonorrhea: NEGATIVE

## 2021-02-11 LAB — CERVICOVAGINAL ANCILLARY ONLY
Bacterial Vaginitis (gardnerella): NEGATIVE
Candida Glabrata: NEGATIVE
Candida Vaginitis: NEGATIVE
Chlamydia: NEGATIVE
Comment: NEGATIVE
Comment: NEGATIVE
Comment: NEGATIVE
Comment: NEGATIVE
Comment: NEGATIVE
Comment: NORMAL
Neisseria Gonorrhea: NEGATIVE
Trichomonas: NEGATIVE

## 2021-02-11 NOTE — Progress Notes (Signed)
°  [facility] Prenatal Visit  Natalie Mason is a 21 y.o. [phone] at [redacted]w[redacted]d here for routine follow up. She is dated by early ultrasound at [redacted]w[redacted]d.  She reports  irregular contractions and pelvic pressure . She reports fetal movement. She denies vaginal bleeding, contractions, or loss of fluid. Was last seen in the MAU on 1/16 for false labor. Cervical check at that time "tight 1 cm, thick, vertex at -2 station". Fern test was negative. Contractions were irregular, every 3-8 minutes.   See flow sheet for details.  Vitals:   02/19/21 1032  BP: 118/69  Pulse: 86     A/P: Pregnancy at [redacted]w[redacted]d.  Doing well.   Routine prenatal care:  Dating reviewed, dating tab is correct Fetal heart tones Appropriate Fundal height within expected range.  Fetal position confirmed Vertex using Leopold's .  Infant feeding choice: Breastfeeding Contraception choice: Pills  Infant circumcision desired yes Pain control in labor discussed and patient desires natural labor.  Influenza vaccine not administered as patient declined, will continue to discuss.   Tdap previously administered between 27-36 weeks  GBS and gc/chlamydia testing results were reviewed today.   Pregnancy education regarding labor, fetal movement,  benefits of breastfeeding, contraception, and safe infant sleep were discussed.  Labor and fetal movement precautions reviewed. Induction of labor discussed. Scheduled for induction at approximately 40 weeks. BPP scheduled between 40-41 weeks.   2. Pregnancy issues include the following and were addressed as appropriate today:  Hx of Gestational HTN: on 81 mg ASA, has been intermittently adherent  Hx GBS bacteruria and PCN allergy. GBS negative.  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 CBC today. Cervical check offered today: 2cm/soft, 40-50%/-2 station Problem list and pregnancy box updated: Yes.   Follow up  1 week.

## 2021-02-11 NOTE — Patient Instructions (Signed)
It was wonderful to see you today.  Please bring ALL of your medications with you to every visit.   Today we talked about:  If you experience any vaginal bleeding, leakage of fluids, don't feel your baby moving as much, or start to have contractions less than 5 minutes apart please go directly to the Maternal Assessment Unit at [facility] for evaluation.  Maternity and women's care services located on the south side of The [place]. [facility] (Entrance C off [address]).  [address] Entrance C [place],  [place]  [address]      Thank you for choosing [facility].   Please call [phone] with any questions about today's appointment.  Please be sure to schedule follow up at the front  desk before you leave today.   Lloyde Ludlam, DO PGY-2 Family Medicine   

## 2021-02-14 LAB — CULTURE, BETA STREP (GROUP B ONLY): Strep Gp B Culture: NEGATIVE

## 2021-02-17 ENCOUNTER — Other Ambulatory Visit: Payer: Self-pay

## 2021-02-17 ENCOUNTER — Encounter (HOSPITAL_COMMUNITY): Payer: Self-pay | Admitting: Obstetrics & Gynecology

## 2021-02-17 ENCOUNTER — Inpatient Hospital Stay (HOSPITAL_COMMUNITY)
Admission: AD | Admit: 2021-02-17 | Discharge: 2021-02-17 | Disposition: A | Discharge status: Home / Self Care | Payer: Medicaid Other | Attending: Obstetrics & Gynecology | Admitting: Obstetrics & Gynecology

## 2021-02-17 DIAGNOSIS — O479 False labor, unspecified: Secondary | ICD-10-CM

## 2021-02-17 DIAGNOSIS — Z3A37 37 weeks gestation of pregnancy: Secondary | ICD-10-CM | POA: Insufficient documentation

## 2021-02-17 DIAGNOSIS — O471 False labor at or after 37 completed weeks of gestation: Secondary | ICD-10-CM | POA: Insufficient documentation

## 2021-02-17 LAB — WET PREP, GENITAL
Clue Cells Wet Prep HPF POC: NONE SEEN
Sperm: NONE SEEN
Trich, Wet Prep: NONE SEEN
WBC, Wet Prep HPF POC: >=10 — AB (ref <10)
Yeast Wet Prep HPF POC: NONE SEEN

## 2021-02-17 LAB — POCT FERN TEST: POCT Fern Test: NEGATIVE

## 2021-02-17 NOTE — Discharge Instructions (Signed)
Return if you have lots more leaking of fluid that is continuous, or vaginal bleeding. Expect your contractions to get more regular and stronger to be able to open the cervix more.

## 2021-02-17 NOTE — MAU Provider Note (Signed)
History     CSN: [id number]  Arrival date and time: 02/17/21 1452  Seen by provider at 3:25 pm    Chief Complaint  Patient presents with   Contractions   HPI Comes to MAU with contractions and thinking her water broke.  Has noticed fluid leading after she urinates while still sitting on the toilet.  No leaking at other times.  Did not wear a pad into the hospital.  OB History     Gravida  4   Para  2   Term  2   Preterm      AB  1   Living  2      SAB  1   IAB      Ectopic      Multiple  0   Live Births  2           Past Medical History:  Diagnosis Date   ASCUS with positive high risk HPV cervical 02/10/2021   Iron deficiency anemia 02/10/2021   Pregnancy induced hypertension     Past Surgical History:  Procedure Laterality Date   TUMOR REMOVAL Left     Family History  Problem Relation Age of Onset   Mental illness Mother    Depression Mother    Bipolar disorder Mother    Hypertension Maternal Grandmother    Diabetes Maternal Grandmother    Bipolar disorder Sister    Hearing loss Brother    Anxiety disorder Maternal Uncle    Mental illness Maternal Uncle     Social History   Tobacco Use   Smoking status: Never    Passive exposure: Yes   Smokeless tobacco: Never  Vaping Use   Vaping Use: Never used  Substance Use Topics   Alcohol use: No   Drug use: No    Allergies:  Allergies  Allergen Reactions   Penicillins Rash    Has patient had a PCN reaction causing immediate rash, facial/tongue/throat swelling, SOB or lightheadedness with hypotension: Yes Has patient had a PCN reaction causing severe rash involving mucus membranes or skin necrosis: No Has patient had a PCN reaction that required hospitalization: No Has patient had a PCN reaction occurring within the last 10 years: No If all of the above answers are "NO", then may proceed with Cephalosporin use. Per pt- allergy was rash only - no facial/tongue swelling    Medications  Prior to Admission  Medication Sig Dispense Refill Last Dose   aspirin EC 81 MG tablet Take 1 tablet (81 mg total) by mouth daily. Swallow whole. 30 tablet 11 Past Month   Prenatal Multivit-Min-FA ([facility] PRENATAL GUMMY) 0.4 MG CHEW Chew 1 each by mouth daily. 30 tablet 1 Past Week    Review of Systems  Constitutional:  Negative for fever.  Gastrointestinal:  Negative for nausea and vomiting.       Contractions and leaking fluid  Genitourinary:  Negative for dysuria, vaginal bleeding and vaginal discharge.  Physical Exam   Blood pressure 101/74, pulse 88, temperature 98.7 F (37.1 C), temperature source Oral, resp. rate 16, height 5\' 4"  (1.626 m), weight 59 kg, last menstrual period 04/22/2020, SpO2 98 %, unknown if currently breastfeeding.  Physical Exam Vitals and nursing note reviewed.  Constitutional:      Appearance: She is well-developed.  HENT:     Head: Normocephalic.  Cardiovascular:     Rate and Rhythm: Regular rhythm.  Pulmonary:     Effort: Pulmonary effort is normal.  Abdominal:  Palpations: Abdomen is soft.     Tenderness: There is no abdominal tenderness. There is no guarding or rebound.  Genitourinary:    Comments: Speculum exam - no pooling of fluid, no leaking in vagina with valsalva.  Dark pink vaginal tissue, white grainy adherent discharge noted suspicious for yeast infection. Cervical check - tight 1 cm, thick, vertex at -2 station Musculoskeletal:        General: Normal range of motion.     Cervical back: Neck supple.  Skin:    General: Skin is warm and dry.  Neurological:     Mental Status: She is alert and oriented to person, place, and time.  Psychiatric:        Mood and Affect: Mood normal.        Behavior: Behavior normal.        Thought Content: Thought content normal.   FHT baseline 140 with moderate variability, 15x15 accels noted, no decelerations, irregular contractions 3-8 minutes  MAU Course  Procedures  MDM No evidence of ROM, no  fern done as no pooling and very little fluid in the vagina.  Will do wet prep to check for vaginal yeast infection.  Assessment and Plan  False labor at [redacted]w[redacted]d No ROM No yeast infection on wet prep  Plan Keep appointments in the office. Reviewed signs of labor. Return with labor or ROM or vaginal bleeding.  Revan Gendron L Alveena Taira 02/17/2021, 3:36 PM

## 2021-02-17 NOTE — MAU Note (Signed)
I have communicated with Earlie Server, NP and reviewed vital signs:  Vitals:   02/17/21 1515 02/17/21 1600  BP: 101/74 111/73  Pulse: 88 81  Resp:    Temp:    SpO2: 98%     Vaginal exam:  Dilation: Fingertip Effacement (%): Thick Exam by:: Earlie Server, NP,   Also reviewed contraction pattern and that non-stress test is reactive.  It has been documented that patient is contracting irregularly. Patient denies any other complaints.  Based on this report provider has given order for discharge.  A discharge order and diagnosis entered by a provider.   Labor discharge instructions reviewed with patient. Pt verbalized understanding on when to return to the hospital.

## 2021-02-17 NOTE — MAU Note (Signed)
.  Natalie Mason is a 21 y.o. at [redacted]w[redacted]d here in MAU reporting: UC that are 8 minutes apart that started yesterday afternoon. Reports clear, thin LOF each time after going to the bathroom. Denies VB. Endorses +FM.   Onset of complaint: 02/17/21 Pain score: 3/10 Vitals:   02/17/21 1507 02/17/21 1515  BP: 123/71 101/74  Pulse: 94 88  Resp: 16   Temp: 98.7 F (37.1 C)   SpO2: 100% 98%     FHT: 134 bpm

## 2021-02-19 ENCOUNTER — Other Ambulatory Visit: Payer: Self-pay

## 2021-02-19 ENCOUNTER — Ambulatory Visit (INDEPENDENT_AMBULATORY_CARE_PROVIDER_SITE_OTHER): Payer: Medicaid Other | Admitting: Family Medicine

## 2021-02-19 VITALS — BP 118/69 | HR 86 | Wt 137.8 lb

## 2021-02-19 DIAGNOSIS — D500 Iron deficiency anemia secondary to blood loss (chronic): Secondary | ICD-10-CM | POA: Diagnosis not present

## 2021-02-19 DIAGNOSIS — Z3483 Encounter for supervision of other normal pregnancy, third trimester: Secondary | ICD-10-CM

## 2021-02-20 LAB — CBC
Hematocrit: 27.3 % — ABNORMAL LOW (ref 34.0–46.6)
Hemoglobin: 9.1 g/dL — ABNORMAL LOW (ref 11.1–15.9)
MCH: 27.3 pg (ref 26.6–33.0)
MCHC: 33.3 g/dL (ref 31.5–35.7)
MCV: 82 fL (ref 79–97)
Platelets: 254 10*3/uL (ref 150–450)
RBC: 3.33 x10E6/uL — ABNORMAL LOW (ref 3.77–5.28)
RDW: 13.2 % (ref 11.7–15.4)
WBC: 7.3 10*3/uL (ref 3.4–10.8)

## 2021-02-21 NOTE — Progress Notes (Signed)
°  [facility] Prenatal Visit  Natalie Mason is a 21 y.o. [phone] at [redacted]w[redacted]d here for routine follow up. She is dated by early ultrasound at [redacted]w[redacted]d.  She reports  irregular contractions . She reports fetal movement. She denies vaginal bleeding, contractions, or loss of fluid. See flow sheet for details.  Vitals:   02/24/21 1101  BP: 116/72  Pulse: 85   A/P: Pregnancy at [redacted]w[redacted]d.  Doing well.   Routine prenatal care:  Dating reviewed, dating tab is correct Fetal heart tones Appropriate Fundal height within expected range.  Fetal position confirmed Vertex using Leopold's .  Infant feeding choice: Breastfeeding Contraception choice: Pills  Infant circumcision desired yes Pain control in labor discussed and patient desires no epidural.  Influenza vaccine not administered as patient declined, will continue to discuss.   Tdap previously administered between 27-36 weeks  GBS and gc/chlamydia testing results were reviewed today.   Pregnancy education regarding labor, fetal movement,  benefits of breastfeeding, contraception, and safe infant sleep were discussed.  Labor and fetal movement precautions reviewed. Induction of labor discussed. Scheduled for induction at [redacted]w[redacted]d per patient request.   2. Pregnancy issues include the following and were addressed as appropriate today:  Scheduled for induction on 1/28 in AM. Discussed with L&D, form faxed. Anemia: Hgb from 1/18 was 9.1. Discussed importance of iron supplementation. Hx of Gestational HTN: on 81 mg ASA, has been intermittently adherent. Discussed importance. Normotensive today. GBS negative.  ASCUS + HPV; repeat Pap needed June 2023 Problem list and pregnancy box updated: Yes.   Follow up 6-weeks post-partum

## 2021-02-24 ENCOUNTER — Other Ambulatory Visit: Payer: Self-pay

## 2021-02-24 ENCOUNTER — Ambulatory Visit (INDEPENDENT_AMBULATORY_CARE_PROVIDER_SITE_OTHER): Payer: Medicaid Other | Admitting: Family Medicine

## 2021-02-24 VITALS — BP 116/72 | HR 85 | Wt 140.5 lb

## 2021-02-24 DIAGNOSIS — Z3483 Encounter for supervision of other normal pregnancy, third trimester: Secondary | ICD-10-CM | POA: Diagnosis not present

## 2021-02-24 DIAGNOSIS — O99013 Anemia complicating pregnancy, third trimester: Secondary | ICD-10-CM | POA: Diagnosis not present

## 2021-02-24 NOTE — Patient Instructions (Addendum)
It was wonderful to see you today.   Today we talked about:  We have scheduled you for a morning induction on 1/28. Have your phone near you that morning as they will call you as early as 6AM.   Please take your iron supplements daily!   If you experience any vaginal bleeding, leakage of fluids, don't feel your baby moving as much, or start to have contractions less than 5 minutes apart please go directly to the Maternal Assessment Unit at [facility] for evaluation. You should feel about ten movements (kicks, flutters or rolls) in one hour. If you feel fewer than 10 kicks, try laying on your left side with your feet elevated. You can use a timer to see how long it takes you to feel 10 kicks. If you don't feel 10 movements within two hours- try taking a walk, drinking juice or a sweet beverage, eating a meal, or playing loud music. If you are still not feeling baby move much, you should go to the Maternity Assessment Unit shown below.  Maternity and women's care services located on the [place] side of The [place]. [facility] (Entrance C off [address]).  [address] Entrance C [place],  [place]  [address]      Thank you for choosing [facility].   Please call [phone] with any questions about today's appointment.  Please be sure to schedule follow up at the front  desk before you leave today.   Sabino Dick, DO PGY-2 Family Medicine

## 2021-02-25 ENCOUNTER — Telehealth (HOSPITAL_COMMUNITY): Payer: Self-pay | Admitting: *Deleted

## 2021-02-25 ENCOUNTER — Encounter (HOSPITAL_COMMUNITY): Payer: Self-pay | Admitting: *Deleted

## 2021-02-25 ENCOUNTER — Encounter (HOSPITAL_COMMUNITY): Payer: Self-pay

## 2021-02-25 NOTE — Telephone Encounter (Signed)
Preadmission screen  

## 2021-02-26 ENCOUNTER — Encounter (HOSPITAL_COMMUNITY): Payer: Self-pay | Admitting: Family Medicine

## 2021-02-26 ENCOUNTER — Other Ambulatory Visit: Payer: Self-pay | Admitting: Advanced Practice Midwife

## 2021-03-01 ENCOUNTER — Encounter (HOSPITAL_COMMUNITY): Payer: Self-pay | Admitting: Obstetrics & Gynecology

## 2021-03-01 ENCOUNTER — Inpatient Hospital Stay (HOSPITAL_COMMUNITY): Payer: Medicaid Other

## 2021-03-01 ENCOUNTER — Other Ambulatory Visit: Payer: Self-pay

## 2021-03-01 ENCOUNTER — Inpatient Hospital Stay (HOSPITAL_COMMUNITY)
Admission: AD | Admit: 2021-03-01 | Discharge: 2021-03-03 | DRG: 806 | Disposition: A | Discharge status: Home / Self Care | Payer: Medicaid Other | Attending: Obstetrics & Gynecology | Admitting: Obstetrics & Gynecology

## 2021-03-01 DIAGNOSIS — D62 Acute posthemorrhagic anemia: Secondary | ICD-10-CM | POA: Diagnosis not present

## 2021-03-01 DIAGNOSIS — R87610 Atypical squamous cells of undetermined significance on cytologic smear of cervix (ASC-US): Secondary | ICD-10-CM | POA: Diagnosis present

## 2021-03-01 DIAGNOSIS — Z880 Allergy status to penicillin: Secondary | ICD-10-CM

## 2021-03-01 DIAGNOSIS — O9081 Anemia of the puerperium: Secondary | ICD-10-CM | POA: Diagnosis not present

## 2021-03-01 DIAGNOSIS — O26893 Other specified pregnancy related conditions, third trimester: Secondary | ICD-10-CM | POA: Diagnosis not present

## 2021-03-01 DIAGNOSIS — Z3A39 39 weeks gestation of pregnancy: Secondary | ICD-10-CM

## 2021-03-01 DIAGNOSIS — Z20822 Contact with and (suspected) exposure to covid-19: Secondary | ICD-10-CM | POA: Diagnosis present

## 2021-03-01 DIAGNOSIS — O9852 Other viral diseases complicating childbirth: Secondary | ICD-10-CM | POA: Diagnosis present

## 2021-03-01 DIAGNOSIS — Z3490 Encounter for supervision of normal pregnancy, unspecified, unspecified trimester: Secondary | ICD-10-CM | POA: Diagnosis present

## 2021-03-01 DIAGNOSIS — R87810 Cervical high risk human papillomavirus (HPV) DNA test positive: Secondary | ICD-10-CM | POA: Diagnosis not present

## 2021-03-01 DIAGNOSIS — D509 Iron deficiency anemia, unspecified: Secondary | ICD-10-CM | POA: Diagnosis present

## 2021-03-01 LAB — RESP PANEL BY RT-PCR (FLU A&B, COVID) ARPGX2
Influenza A by PCR: NEGATIVE
Influenza B by PCR: NEGATIVE
SARS Coronavirus 2 by RT PCR: NEGATIVE

## 2021-03-01 LAB — TYPE AND SCREEN
ABO/RH(D): O POS
Antibody Screen: NEGATIVE

## 2021-03-01 LAB — CBC
HCT: 28 % — ABNORMAL LOW (ref 36.0–46.0)
Hemoglobin: 8.8 g/dL — ABNORMAL LOW (ref 12.0–15.0)
MCH: 26 pg (ref 26.0–34.0)
MCHC: 31.4 g/dL (ref 30.0–36.0)
MCV: 82.8 fL (ref 80.0–100.0)
Platelets: 313 10*3/uL (ref 150–400)
RBC: 3.38 MIL/uL — ABNORMAL LOW (ref 3.87–5.11)
RDW: 14.8 % (ref 11.5–15.5)
WBC: 7.5 10*3/uL (ref 4.0–10.5)
nRBC: 0 % (ref 0.0–0.2)

## 2021-03-01 LAB — RPR: RPR Ser Ql: NONREACTIVE

## 2021-03-01 MED ORDER — DIBUCAINE (PERIANAL) 1 % EX OINT: 1.0000 "application " | TOPICAL_OINTMENT | CUTANEOUS | Status: DC | PRN

## 2021-03-01 MED ORDER — BENZOCAINE-MENTHOL 20-0.5 % EX AERO: 1.0000 "application " | INHALATION_SPRAY | CUTANEOUS | Status: DC | PRN

## 2021-03-01 MED ORDER — OXYCODONE-ACETAMINOPHEN 5-325 MG PO TABS: 2.0000 | ORAL_TABLET | ORAL | Status: DC | PRN

## 2021-03-01 MED ORDER — PRENATAL MULTIVITAMIN CH: 1.0000 | ORAL_TABLET | Freq: Every day | ORAL | Status: DC

## 2021-03-01 MED ORDER — OXYTOCIN-SODIUM CHLORIDE 30-0.9 UT/500ML-% IV SOLN
2.5000 [IU]/h | INTRAVENOUS | Status: DC
  Filled 2021-03-01: qty 500

## 2021-03-01 MED ORDER — OXYCODONE-ACETAMINOPHEN 5-325 MG PO TABS: 1.0000 | ORAL_TABLET | ORAL | Status: DC | PRN

## 2021-03-01 MED ORDER — TRANEXAMIC ACID-NACL 1000-0.7 MG/100ML-% IV SOLN: 1000.0000 mg | Freq: Once | INTRAVENOUS | Status: AC

## 2021-03-01 MED ORDER — TERBUTALINE SULFATE 1 MG/ML IJ SOLN: 0.2500 mg | Freq: Once | INTRAMUSCULAR | Status: DC | PRN

## 2021-03-01 MED ORDER — DIPHENHYDRAMINE HCL 25 MG PO CAPS: 25.0000 mg | ORAL_CAPSULE | Freq: Four times a day (QID) | ORAL | Status: DC | PRN

## 2021-03-01 MED ORDER — SIMETHICONE 80 MG PO CHEW: 80.0000 mg | CHEWABLE_TABLET | ORAL | Status: DC | PRN

## 2021-03-01 MED ORDER — ACETAMINOPHEN 325 MG PO TABS: 650.0000 mg | ORAL_TABLET | ORAL | Status: DC | PRN

## 2021-03-01 MED ORDER — LACTATED RINGERS IV SOLN: INTRAVENOUS | Status: DC

## 2021-03-01 MED ORDER — IBUPROFEN 600 MG PO TABS
600.0000 mg | ORAL_TABLET | Freq: Four times a day (QID) | ORAL | Status: DC
  Filled 2021-03-01: qty 1

## 2021-03-01 MED ORDER — LIDOCAINE HCL (PF) 1 % IJ SOLN: 30.0000 mL | INTRAMUSCULAR | Status: DC | PRN

## 2021-03-01 MED ORDER — SOD CITRATE-CITRIC ACID 500-334 MG/5ML PO SOLN: 30.0000 mL | ORAL | Status: DC | PRN

## 2021-03-01 MED ORDER — ONDANSETRON HCL 4 MG/2ML IJ SOLN: 4.0000 mg | INTRAMUSCULAR | Status: DC | PRN

## 2021-03-01 MED ORDER — TRANEXAMIC ACID-NACL 1000-0.7 MG/100ML-% IV SOLN
INTRAVENOUS | Status: AC
  Administered 2021-03-01: 1000 mg via INTRAVENOUS
  Filled 2021-03-01: qty 100

## 2021-03-01 MED ORDER — SENNOSIDES-DOCUSATE SODIUM 8.6-50 MG PO TABS
2.0000 | ORAL_TABLET | Freq: Every day | ORAL | Status: DC
  Administered 2021-03-03: 2 via ORAL
  Filled 2021-03-01 (×2): qty 2

## 2021-03-01 MED ORDER — ONDANSETRON HCL 4 MG PO TABS: 4.0000 mg | ORAL_TABLET | ORAL | Status: DC | PRN

## 2021-03-01 MED ORDER — OXYTOCIN BOLUS FROM INFUSION
333.0000 mL | Freq: Once | INTRAVENOUS | Status: AC
  Administered 2021-03-01: 333 mL via INTRAVENOUS

## 2021-03-01 MED ORDER — TETANUS-DIPHTH-ACELL PERTUSSIS 5-2.5-18.5 LF-MCG/0.5 IM SUSY: 0.5000 mL | PREFILLED_SYRINGE | Freq: Once | INTRAMUSCULAR | Status: DC

## 2021-03-01 MED ORDER — LACTATED RINGERS IV SOLN: 500.0000 mL | INTRAVENOUS | Status: DC | PRN

## 2021-03-01 MED ORDER — FENTANYL CITRATE (PF) 100 MCG/2ML IJ SOLN
100.0000 ug | INTRAMUSCULAR | Status: DC | PRN
  Administered 2021-03-01: 100 ug via INTRAVENOUS
  Filled 2021-03-01: qty 2

## 2021-03-01 MED ORDER — OXYTOCIN-SODIUM CHLORIDE 30-0.9 UT/500ML-% IV SOLN
1.0000 m[IU]/min | INTRAVENOUS | Status: DC
  Administered 2021-03-01: 2 m[IU]/min via INTRAVENOUS

## 2021-03-01 MED ORDER — WITCH HAZEL-GLYCERIN EX PADS: 1.0000 "application " | MEDICATED_PAD | CUTANEOUS | Status: DC | PRN

## 2021-03-01 MED ORDER — ZOLPIDEM TARTRATE 5 MG PO TABS: 5.0000 mg | ORAL_TABLET | Freq: Every evening | ORAL | Status: DC | PRN

## 2021-03-01 MED ORDER — ONDANSETRON HCL 4 MG/2ML IJ SOLN: 4.0000 mg | Freq: Four times a day (QID) | INTRAMUSCULAR | Status: DC | PRN

## 2021-03-01 MED ORDER — COCONUT OIL OIL: 1.0000 "application " | TOPICAL_OIL | Status: DC | PRN

## 2021-03-01 NOTE — Progress Notes (Signed)
Natalie Mason is a 21 y.o. [id number] at [redacted]w[redacted]d  admitted for induction of labor due to Elective at term.  Subjective: Reports feeling contractions more but still tolerable  Objective: BP (!) 107/58    Pulse 73    Temp 98.1 F (36.7 C) (Oral)    Resp 16    Ht 5' 4.5" (1.638 m)    Wt 66.5 kg    LMP 04/22/2020    BMI 24.78 kg/m  No intake/output data recorded. No intake/output data recorded.  FHT:  FHR: 140 bpm, variability: moderate,  accelerations:  Present,  decelerations:  Absent UC:   regular, every 2-3 minutes SVE:   Dilation: 4.5 Effacement (%): 80 Station: -1 Exam by:: Dr. Cy Blamer  Labs: Lab Results  Component Value Date   WBC 7.5 03/01/2021   HGB 8.8 (L) 03/01/2021   HCT 28.0 (L) 03/01/2021   MCV 82.8 03/01/2021   PLT 313 03/01/2021    Assessment / Plan: [id number] at [redacted]w[redacted]d  admitted for induction of labor due to Elective at term.  Labor:  S/p foley bulb and cytotec x1. AROMed during current check. Will continue to increase pitocin as tolerated Fetal Wellbeing:  Category I Pain Control:  IV pain meds I/D:   GBS neg   Renard Matter 03/01/2021, 4:27 PM

## 2021-03-01 NOTE — H&P (Signed)
OBSTETRIC ADMISSION HISTORY AND PHYSICAL  Natalie Mason is a 21 y.o. female [phone] with IUP at [redacted]w[redacted]d by early [place] presenting for elective IOL. She reports +FMs, No LOF, no VB, no blurry vision, headaches or peripheral edema, and RUQ pain.  She plans on breast feeding. She request POPs for birth control. 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   Iron deficiency anemia 02/10/2021   Pregnancy induced hypertension     Past Surgical History: Past Surgical History:  Procedure Laterality Date   TUMOR REMOVAL Left     Obstetrical History: OB History     Gravida  4   Para  2   Term  2   Preterm      AB  1   Living  2      SAB  1   IAB      Ectopic      Multiple  0   Live Births  2           Social History Social History   Socioeconomic History   Marital status: Significant Other    Spouse name: 04/10/2021   Number of children: 1   Years of education: Not on file   Highest education level: High school graduate  Occupational History   Occupation: homemaker  Tobacco Use   Smoking status: Never    Passive exposure: Yes   Smokeless tobacco: Never  Vaping Use   Vaping Use: Never used  Substance and Sexual Activity   Alcohol use: No   Drug use: No   Sexual activity: Not Currently    Birth control/protection: None  Other Topics Concern   Not on file  Social History Narrative   Not on file   Social Determinants of Health   Financial Resource Strain: Not on file  Food Insecurity: Not on file  Transportation Needs: Not on file   Physical Activity: Not on file  Stress: Not on file  Social Connections: Not on file    Family History: Family History  Problem Relation Age of Onset   Mental illness Mother    Depression Mother    Bipolar disorder Mother    Hypertension Maternal Grandmother    Diabetes Maternal Grandmother    Bipolar disorder Sister    Hearing loss Brother    Anxiety disorder Maternal Uncle    Mental illness Maternal Uncle     Allergies: Allergies  Allergen Reactions   Penicillins Rash    Has patient had a PCN reaction causing immediate rash, facial/tongue/throat swelling, SOB or lightheadedness with hypotension: Yes Has patient had a PCN reaction causing severe rash involving mucus membranes or skin necrosis: No Has patient had a PCN reaction that required hospitalization: No Has patient had a PCN reaction occurring within the last 10 years: No If all of the above answers are "NO", then may proceed with Cephalosporin use. Per pt- allergy was rash only - no facial/tongue swelling    Medications Prior to Admission  Medication Sig Dispense Refill Last Dose   aspirin EC 81 MG tablet  Take 1 tablet (81 mg total) by mouth daily. Swallow whole. 30 tablet 11 Past Month   Prenatal Multivit-Min-FA ([facility] PRENATAL GUMMY) 0.4 MG CHEW Chew 1 each by mouth daily. 30 tablet 1 Past Month     Review of Systems   All systems reviewed and negative except as stated in HPI  Blood pressure 117/78, pulse 71, temperature 99.2 F (37.3 C), temperature source Oral, resp. rate 16, height 5' 4.5" (1.638 m), weight 66.5 kg, last menstrual period 04/22/2020, unknown if currently breastfeeding. General appearance: alert Lungs: clear to auscultation bilaterally Heart: regular rate and rhythm Abdomen: soft, non-tender; bowel sounds normal Extremities: Homans sign is negative, no sign of DVT Presentation: cephalic Fetal monitoringBaseline: 140 bpm, Variability: Good {> 6 bpm), Accelerations: Reactive, and  Decelerations: Absent Uterine activity irregular 3-7 min Dilation: 2 Effacement (%): 50 Station: -1, -2 Exam by:: Dr. Ephriam Jenkins   Prenatal labs: ABO, Rh: --/--/O POS (01/28 7867) Antibody: NEG (01/28 0757) Rubella: 8.26 (06/01 1458) RPR: Non Reactive (11/22 1547)  HBsAg: Negative (06/01 1458)  HIV: Non Reactive (11/22 1547)  GBS: Negative/-- (01/09 1401)  1 hr Glucola normal Genetic screening  declined Anatomy US normal  Prenatal Transfer Tool  Maternal Diabetes: No Genetic Screening: Declined Maternal Ultrasounds/Referrals: Normal Fetal Ultrasounds or other Referrals:  None Maternal Substance Abuse:  No Significant Maternal Medications:  None Significant Maternal Lab Results: Group B Strep negative  Results for orders placed or performed during the hospital encounter of 03/01/21 (from the past 24 hour(s))  CBC   Collection Time: 03/01/21  7:57 AM  Result Value Ref Range   WBC 7.5 4.0 - 10.5 K/uL   RBC 3.38 (L) 3.87 - 5.11 MIL/uL   Hemoglobin 8.8 (L) 12.0 - 15.0 g/dL   HCT 67.2 (L) 09.4 - 70.9 %   MCV 82.8 80.0 - 100.0 fL   MCH 26.0 26.0 - 34.0 pg   MCHC 31.4 30.0 - 36.0 g/dL   RDW 62.8 36.6 - 29.4 %   Platelets 313 150 - 400 K/uL   nRBC 0.0 0.0 - 0.2 %  Type and screen   Collection Time: 03/01/21  7:57 AM  Result Value Ref Range   ABO/RH(D) O POS    Antibody Screen NEG    Sample Expiration      03/04/2021,2359 Performed at [facility], 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 [id number] at [redacted]w[redacted]d here for elective IOL  #Labor:Cervix 2 cm. Contracting some on own. Foley balloon placed during current check.  Will assess contraction pattern and add Cytotec if appropriate. #Pain: Plans IV pain medications #FWB: Cat I #ID:  GBS neg #MOF: breast #MOC:POPs #Circ:  yes   #Anemia of pregnancy #Iron deficiency anemia H/H 9.1/27.3 on 02/19/2021. Admission CBC H/H 8.8/28.0. Plan for TXA at delivery  Warner Mccreedy, MD, MPH OB Fellow, Faculty Practice

## 2021-03-01 NOTE — Discharge Summary (Signed)
Postpartum Discharge Summary  Date of Service updated     Patient Name: Natalie Mason DOB: [birth year] MRN: [id number]  Date of admission: 03/01/2021 Delivery date:03/01/2021  Delivering provider: Renee Harder  Date of discharge: 03/03/2021  Admitting diagnosis: Encounter for elective induction of labor [Z34.90] Intrauterine pregnancy: [redacted]w[redacted]d    Secondary diagnosis:  Principal Problem:   SVD (spontaneous vaginal delivery) Active Problems:   Supervision of normal pregnancy   Iron deficiency anemia   ASCUS with positive high risk HPV cervical   Encounter for elective induction of labor  Additional problems: None    Discharge diagnosis: Term Pregnancy Delivered                                              Post partum procedures: None  Augmentation: AROM, Pitocin, and IP Foley Complications: None  Hospital course: Induction of Labor With Vaginal Delivery   21y.o. yo [id number] admitted to the hospital 03/01/2021 for induction of labor.  Indication for induction: Elective.  Patient had an uncomplicated labor course as follows: Membrane Rupture Time/Date: 4:10 PM ,03/01/2021   Delivery Method:Vaginal, Spontaneous  Episiotomy: None  Lacerations:  None  Details of delivery can be found in separate delivery note.  Patient had a routine postpartum course. Patient is discharged home 03/03/21.  Newborn Data: Birth date:03/01/2021  Birth time:8:17 PM  Gender:Female  Living status:Living  Apgars:9 ,9  Weight:3235 g   Magnesium Sulfate received: No BMZ received: No Rhophylac:N/A MMR:N/A T-DaP:Given prenatally Flu: No Transfusion:No  Physical exam  Vitals:   03/02/21 0921 03/02/21 1338 03/02/21 2040 03/03/21 0530  BP: 109/61 116/64 116/67 110/75  Pulse: 69 68 66 70  Resp: 18 18 18 18   Temp: 98.4 F (36.9 C)  97.8 F (36.6 C) 98.4 F (36.9 C)  TempSrc: Oral  Oral Oral  SpO2: 100% 99% 100% 99%  Weight:      Height:       General: alert, cooperative,  and no distress Lochia: appropriate Uterine Fundus: firm Incision: N/A DVT Evaluation: No significant calf/ankle edema. Labs: Lab Results  Component Value Date   WBC 7.5 03/01/2021   HGB 8.8 (L) 03/01/2021   HCT 28.0 (L) 03/01/2021   MCV 82.8 03/01/2021   PLT 313 03/01/2021   CMP Latest Ref Rng & Units 05/02/2017  Glucose 65 - 99 mg/dL 79  BUN 6 - 20 mg/dL <5(L)  Creatinine 0.50 - 1.00 mg/dL 0.57  Sodium 135 - 145 mmol/L 134(L)  Potassium 3.5 - 5.1 mmol/L 3.2(L)  Chloride 101 - 111 mmol/L 103  CO2 22 - 32 mmol/L 20(L)  Calcium 8.9 - 10.3 mg/dL 8.7(L)  Total Protein 6.5 - 8.1 g/dL 6.7  Total Bilirubin 0.3 - 1.2 mg/dL 0.5  Alkaline Phos 47 - 119 U/L 179(H)  AST 15 - 41 U/L 17  ALT 14 - 54 U/L 9(L)   Edinburgh Score: Edinburgh Postnatal Depression Scale Screening Tool 03/02/2021  I have been able to laugh and see the funny side of things. 0  I have looked forward with enjoyment to things. 0  I have blamed myself unnecessarily when things went wrong. 1  I have been anxious or worried for no good reason. 0  I have felt scared or panicky for no good reason. 0  Things have been getting on top of me. 1  I have been so unhappy that I have had difficulty sleeping. 0  I have felt sad or miserable. 0  I have been so unhappy that I have been crying. 0  The thought of harming myself has occurred to me. 0  Edinburgh Postnatal Depression Scale Total 2     After visit meds:  Allergies as of 03/03/2021       Reactions   Penicillins Rash   Has patient had a PCN reaction causing immediate rash, facial/tongue/throat swelling, SOB or lightheadedness with hypotension: Yes Has patient had a PCN reaction causing severe rash involving mucus membranes or skin necrosis: No Has patient had a PCN reaction that required hospitalization: No Has patient had a PCN reaction occurring within the last 10 years: No If all of the above answers are "NO", then may proceed with Cephalosporin use. Per pt-  allergy was rash only - no facial/tongue swelling        Medication List     STOP taking these medications    aspirin EC 81 MG tablet       TAKE these medications    acetaminophen 160 MG/5ML solution Commonly known as: TYLENOL Take 20.3 mLs (650 mg total) by mouth every 4 (four) hours as needed (pain scale <4).   [facility]S Prenatal Gummy 0.4 MG Chew Chew 1 each by mouth daily.   ferrous sulfate 325 (65 FE) MG tablet Take 1 tablet (325 mg total) by mouth every other day. Start taking on: March 04, 2021   ibuprofen 100 MG/5ML suspension Commonly known as: ADVIL Take 30 mLs (600 mg total) by mouth every 6 (six) hours as needed.        Discharge home in stable condition Infant Feeding: Breast Infant Disposition:home with mother Discharge instruction: per After Visit Summary and Postpartum booklet. Activity: Advance as tolerated. Pelvic rest for 6 weeks.  Diet: routine diet Future Appointments:No future appointments. Follow up Visit:   Please schedule this patient for a In person postpartum visit in 6 weeks with the following provider: Any provider. Additional Postpartum F/U: None   Low risk pregnancy complicated by:  None  Delivery mode:  Vaginal, Spontaneous  Anticipated Birth Control:  POPs   03/03/2021 Patriciaann Clan, DO

## 2021-03-01 NOTE — Lactation Note (Signed)
This note was copied from a baby's chart. Lactation Consultation Note  Patient Name: Boy Katilin Raynes VEHMC'N Date: 03/01/2021 Reason for consult: L&D Initial assessment;Term Age:22 hours P3, initially mom declined LC services but decided to be seen by LC. LC asked mom to do reverse pressure softening, which help extended nipple shaft out more and mom latched infant on her left breast using the cradle hold. Infant latched with depth and was still breastfeeding after 11 minutes when LC left the room. Mom knows to breastfeed infant according to primal cues, skin to skin. Mom knows to call RN/LC if she need further assistance with latching infant at the breast. Mom will attempt to latch infant on both breast during a feeding. LC congratulated both parents on the birth of their son. Maternal Data Does the patient have breastfeeding experience prior to this delivery?: Yes How long did the patient breastfeed?: Mom requested NS in L&D, per mom, she used them with her previous two children, LC explained each infant is different and let's try latch infant without NS.  Feeding Mother's Current Feeding Choice: Breast Milk  LATCH Score Latch: Grasps breast easily, tongue down, lips flanged, rhythmical sucking.  Audible Swallowing: Spontaneous and intermittent  Type of Nipple: Everted at rest and after stimulation (Mom is  slightly short shafted)  Comfort (Breast/Nipple): Soft / non-tender  Hold (Positioning): Assistance needed to correctly position infant at breast and maintain latch.  LATCH Score: 9   Lactation Tools Discussed/Used    Interventions Interventions: Breast feeding basics reviewed;Assisted with latch;Skin to skin;Reverse pressure;Breast compression;Adjust position;Support pillows;Position options;Education;LC Services brochure  Discharge    Consult Status Consult Status: Follow-up Date: 03/02/21 Follow-up type: In-patient    Danelle Earthly 03/01/2021, 9:21 PM

## 2021-03-02 MED ORDER — ACETAMINOPHEN 160 MG/5ML PO SOLN
650.0000 mg | ORAL | Status: DC | PRN
  Administered 2021-03-02 (×2): 650 mg via ORAL
  Filled 2021-03-02 (×2): qty 20.3

## 2021-03-02 MED ORDER — IBUPROFEN 100 MG/5ML PO SUSP
600.0000 mg | Freq: Four times a day (QID) | ORAL | Status: DC
  Administered 2021-03-02 – 2021-03-03 (×7): 600 mg via ORAL
  Filled 2021-03-02 (×7): qty 30

## 2021-03-02 MED ORDER — COMPLETENATE 29-1 MG PO CHEW
1.0000 | CHEWABLE_TABLET | Freq: Every day | ORAL | Status: DC
  Administered 2021-03-02: 1 via ORAL
  Filled 2021-03-02: qty 1

## 2021-03-02 MED ORDER — FERROUS SULFATE 325 (65 FE) MG PO TABS
325.0000 mg | ORAL_TABLET | ORAL | Status: DC
  Administered 2021-03-02: 325 mg via ORAL
  Filled 2021-03-02: qty 1

## 2021-03-02 NOTE — Lactation Note (Signed)
This note was copied from a baby's chart. Lactation Consultation Note  Patient Name: Natalie Mason [id number] Date: 03/02/2021 Reason for consult: Follow-up assessment Age:22 hours  LC in to room for follow up. Mother states breastfeeding is getting better. Mother reports nipple soreness. Encouraged to use expressed colostrum and air-dry. Reviewed hand pump care and milk storage explained.    Plan: 1-Breastfeeding on demand or 8-12 times in 24h period. 2-Encouraged to use expressed colostrum and air-dry 3-Reinforce maternal rest, hydration and food intake.   Contact LC as needed for feeds/support/concerns/questions. All questions answered at this time.    Feeding Mother's Current Feeding Choice: Breast Milk  Interventions Interventions: Breast feeding basics reviewed;Education;Skin to skin;Hand pump;Expressed milk  Discharge Discharge Education: Engorgement and breast care  Consult Status Consult Status: Follow-up Date: 03/03/21 Follow-up type: In-patient    Mccrae Speciale A Higuera Ancidey 03/02/2021, 3:09 PM

## 2021-03-02 NOTE — Progress Notes (Addendum)
Post Partum Day 1 Subjective: Doing well. No acute events overnight. Pain is controlled and bleeding is appropriate. She is eating, drinking, and ambulating without issue. She has not yet had a BM but has passed flatus. She is breast feeding which is going well. She denies lightheadedness. She has no other concerns at this time.  Objective: Blood pressure (!) 113/58, pulse 72, temperature 98.5 F (36.9 C), temperature source Oral, resp. rate 18, height 5' 4.5" (1.638 m), weight 66.5 kg, last menstrual period 04/22/2020, SpO2 100 %, unknown if currently breastfeeding.  Physical Exam:  General: alert, cooperative, and no distress Uterine Fundus: firm DVT Evaluation: No significant calf/ankle edema.  Recent Labs    03/01/21 0757  HGB 8.8*  HCT 28.0*    Assessment/Plan: Natalie Mason is a 21 y.o. [phone] on PPD# 1 s/p SVD.  Progressing well. Meeting postpartum milestones. VSS. Continue routine postpartum care.  Acute blood loss Anemia: Hgb on 03/01/21 of 8.8 down from 9.1 on 02/19/21. Pt is currently asymptomatic. Will start oral iron supplement today.  Feeding: Breast feeding Contraception: POPs Circumcision: unsure   Dispo:home later today or tomorrow   LOS: 1 day   Erick Alley, DO  03/02/2021, 9:03 AM   GME ATTESTATION:  I saw and evaluated the patient. I agree with the findings and the plan of care as documented in the residents note.Plans for discharge on PPD#2. Unsure about circumcision and patient will discuss further with her partner. Also with hx of anemia - asymptomatic with minimal blood loss at delivery. Will do PO iron.  Warner Mccreedy, MD, MPH OB Fellow, Faculty Practice [facility], Center for [facility] 03/02/2021 11:33 AM

## 2021-03-03 MED ORDER — FERROUS SULFATE 325 (65 FE) MG PO TABS: 325.0000 mg | ORAL_TABLET | ORAL | 0 refills | Status: DC

## 2021-03-03 MED ORDER — IBUPROFEN 100 MG/5ML PO SUSP: 600.0000 mg | Freq: Four times a day (QID) | ORAL | 0 refills | Status: AC | PRN

## 2021-03-03 MED ORDER — NORETHINDRONE 0.35 MG PO TABS: 1.0000 | ORAL_TABLET | Freq: Every day | ORAL | 11 refills | Status: DC

## 2021-03-03 MED ORDER — ACETAMINOPHEN 160 MG/5ML PO SOLN: 650.0000 mg | ORAL | 0 refills | Status: DC | PRN

## 2021-03-03 NOTE — Lactation Note (Signed)
This note was copied from a baby's chart. Lactation Consultation Note  Patient Name: Natalie Mason DUKGU'R Date: 03/03/2021 Reason for consult: Follow-up assessment;Term Age:22 hours  P3 mother whose infant is now 15 hours old.  This is a term baby at 39+4 weeks.  Mother's current feeding preference is breast/formula.  Mother had no questions/concerns related to breast feeding.  She feels like her son is latching well. Last LATCH score was an 8; voiding/stooling well.  She is supplementing with formula.    Mother is familiar with hand expression and engorgement prevention/treatment.  She has a manual pump at bedside for home use if needed.  She has our OP phone number for any further concerns.  Father present and assisting with newborn care.   Maternal Data    Feeding Mother's Current Feeding Choice: Breast Milk and Formula Nipple Type: Slow - flow  LATCH Score                    Lactation Tools Discussed/Used    Interventions Interventions: Education  Discharge Discharge Education: Engorgement and breast care  Consult Status Consult Status: Complete Date: 03/03/21 Follow-up type: Call as needed    Kutler Vanvranken R Villa Burgin 03/03/2021, 8:02 AM

## 2021-03-04 ENCOUNTER — Telehealth: Payer: Self-pay

## 2021-03-04 NOTE — Telephone Encounter (Signed)
Transition Care Management Unsuccessful Follow-up Telephone Call  Date of discharge and from where:  03/03/2021 from [facility]  Attempts:  1st Attempt  Reason for unsuccessful TCM follow-up call:  Unable to reach patient

## 2021-03-04 NOTE — Progress Notes (Signed)
°  Progress Note   Date: 03/03/2021  Patient Name: Natalie Mason        MRN#: [id number]  Clarification of the clinical evidence supporting the diagnosis of acute blood loss anemia:   acute blood loss anemia: Hb dropped to 8.8 after delivery  CBC Latest Ref Rng & Units 03/01/2021 02/19/2021 12/24/2020  WBC 4.0 - 10.5 K/uL 7.5 7.3 7.7  Hemoglobin 12.0 - 15.0 g/dL 8.8(L) 9.1(L) 9.6(L)  Hematocrit 36.0 - 46.0 % 28.0(L) 27.3(L) 29.8(L)  Platelets 150 - 400 K/uL [phone]

## 2021-03-05 NOTE — Telephone Encounter (Signed)
Transition Care Management Follow-up Telephone Call Date of discharge and from where: 03/03/2021 from [facility] How have you been since you were released from the hospital? Pt stated that she is feeling well and did not have any questions or concerns at this time.  Any questions or concerns? No  Items Reviewed: Did the pt receive and understand the discharge instructions provided? Yes  Medications obtained and verified? Yes  Other? No  Any new allergies since your discharge? No  Dietary orders reviewed? No Do you have support at home? Yes   Functional Questionnaire: (I = Independent and D = Dependent) ADLs: I  Bathing/Dressing- I  Meal Prep- I  Eating- I  Maintaining continence- I  Transferring/Ambulation- I  Managing Meds- I  Follow up appointments reviewed:  PCP Hospital f/u appt confirmed? No   Specialist Hospital f/u appt confirmed? No  Calling to schedule delivery follow up.  Are transportation arrangements needed? No  If their condition worsens, is the pt aware to call PCP or go to the Emergency Dept.? Yes Was the patient provided with contact information for the PCP's office or ED? Yes Was to pt encouraged to call back with questions or concerns? Yes

## 2021-03-12 ENCOUNTER — Telehealth (HOSPITAL_COMMUNITY): Payer: Self-pay | Admitting: *Deleted

## 2021-03-12 NOTE — Telephone Encounter (Signed)
Hospital Discharge Follow-Up Call:  Patient reports that she is well and has no concerns about her healing process.  EPDS today was 1 and she endorses this score accurately reflects that she is doing well emotionally.  Patient says that baby is well and she has no concerns about baby's health.  She reports that baby sleeps in a bassinet.  Reviewed ABCs of Safe Sleep.

## 2021-04-17 ENCOUNTER — Encounter: Payer: Medicaid Other | Admitting: Student

## 2021-05-01 ENCOUNTER — Ambulatory Visit (INDEPENDENT_AMBULATORY_CARE_PROVIDER_SITE_OTHER): Payer: Medicaid Other | Admitting: Family Medicine

## 2021-05-01 DIAGNOSIS — D500 Iron deficiency anemia secondary to blood loss (chronic): Secondary | ICD-10-CM | POA: Diagnosis not present

## 2021-05-01 DIAGNOSIS — R87810 Cervical high risk human papillomavirus (HPV) DNA test positive: Secondary | ICD-10-CM | POA: Diagnosis not present

## 2021-05-01 DIAGNOSIS — K5900 Constipation, unspecified: Secondary | ICD-10-CM | POA: Diagnosis not present

## 2021-05-01 DIAGNOSIS — R87610 Atypical squamous cells of undetermined significance on cytologic smear of cervix (ASC-US): Secondary | ICD-10-CM | POA: Diagnosis not present

## 2021-05-01 HISTORY — DX: Constipation, unspecified: K59.00

## 2021-05-01 MED ORDER — POLYETHYLENE GLYCOL 3350 17 GM/SCOOP PO POWD
17.0000 g | Freq: Two times a day (BID) | ORAL | 1 refills | Status: DC | PRN
Start: 2021-05-01 — End: 2023-03-29

## 2021-05-01 MED ORDER — CVS PRENATAL GUMMY 0.4 MG PO CHEW
1.0000 | CHEWABLE_TABLET | Freq: Every day | ORAL | 1 refills | Status: DC
Start: 2021-05-01 — End: 2023-03-29

## 2021-05-01 MED ORDER — NORETHINDRONE 0.35 MG PO TABS: 1.0000 | ORAL_TABLET | Freq: Every day | ORAL | 11 refills | Status: DC

## 2021-05-01 NOTE — Patient Instructions (Signed)
You look tired but otherwise fine. ?Let us know how we can support you. ?

## 2021-05-02 ENCOUNTER — Encounter: Payer: Self-pay | Admitting: Family Medicine

## 2021-05-02 NOTE — Assessment & Plan Note (Signed)
Normal 6 week visit.  No evidence of depression.  Has plan for contraception ?

## 2021-05-02 NOTE — Assessment & Plan Note (Signed)
Did not get CBC this visit.  Needs on next visit.  Today, I put back on her prenatal vits to continue while breast feeding.   ?

## 2021-05-02 NOTE — Progress Notes (Signed)
? ? ?  SUBJECTIVE:  ? ?CHIEF COMPLAINT / HPI:  ? ?Post partum follow up. ?Feels fine.  Breast feeding.  Not currently on prenatal vitamins.  Has lost down to pre pregnancy weight.  Edinburgh Postnatal Derpessio scale administered.  Score=1 no concerns.   ?Has now three children.  Middle child seen today with concerns of autism.  Single mom but good family support.  She is busy with going to school and child raising.   ?No menstrual period yet and she is breast feeding.   ?Hx of abnormal pap.  Due for repeat pap in June 2023. ?Hx of iron deficiency.  Not currently on iron.   ?Plans progestin only BCP.  Has Rx already.  Will start next week. ?Had preg induced hypertension.   ? ? ? ?OBJECTIVE:  ? ?BP 108/60   Pulse 82   Wt 114 lb 12.8 oz (52.1 kg)   SpO2 98%   BMI 19.40 kg/m?   ?Normal BP noted. ?Color not obviously pale in palms or conjunctiva ?Abd benign. ? ?ASSESSMENT/PLAN:  ? ?Postpartum care and examination ?Normal 6 week visit.  No evidence of depression.  Has plan for contraception ? ?Constipation ?Mentioned constipation.  Recommended miralax.   ? ?Iron deficiency anemia ?Did not get CBC this visit.  Needs on next visit.  Today, I put back on her prenatal vits to continue while breast feeding.   ? ?ASCUS with positive high risk HPV cervical ?Needs repeat pap in June 2023 ?  ? ? ?Moses Manners, MD ?[facility]  ?

## 2021-05-02 NOTE — Assessment & Plan Note (Signed)
Needs repeat pap in June 2023 ?

## 2021-05-02 NOTE — Assessment & Plan Note (Signed)
Mentioned constipation.  Recommended miralax.   ?

## 2021-06-09 ENCOUNTER — Other Ambulatory Visit: Payer: Self-pay | Admitting: Family Medicine

## 2021-07-08 ENCOUNTER — Encounter: Payer: Self-pay | Admitting: *Deleted

## 2021-07-09 ENCOUNTER — Telehealth: Payer: Self-pay

## 2021-07-09 NOTE — Telephone Encounter (Signed)
Patient calls nurse line in regards to OCPs.   Patient reports she has been OCPs since she delivered. Patient denies missing any doses and relatively takes them at the same time everyday.   Patient reports she has been having regular periods up until the end of May.   LMP 4/11-4/16. Patient reports she began to spot again for ~ 3 days from 5/27-5/30. Patient described as "old brown blood."   Patient denies any episodes of heavy bleeding or abdominal cramps.   Patient scheduled for 6/9 to discuss.

## 2021-07-11 ENCOUNTER — Ambulatory Visit (INDEPENDENT_AMBULATORY_CARE_PROVIDER_SITE_OTHER): Payer: Medicaid Other | Admitting: Family Medicine

## 2021-07-11 ENCOUNTER — Encounter: Payer: Self-pay | Admitting: Family Medicine

## 2021-07-11 DIAGNOSIS — M79606 Pain in leg, unspecified: Secondary | ICD-10-CM

## 2021-07-11 NOTE — Progress Notes (Signed)
    SUBJECTIVE:   CHIEF COMPLAINT / HPI:   Natalie Mason is a 21 yo who presents for bilateral anterior leg pain which started about 3 days ago. No known injury or trauma. Denies radiation of pain down legs or up back.  Denies loss of bowel and bladder function. Has not tried anything for the pain. Still able to ambulate and perform daily functions.   PERTINENT  PMH / PSH: Reviewed  OBJECTIVE:   BP 110/62   Pulse 74   Ht 5' 4.5" (1.638 m)   Wt 108 lb (49 kg)   LMP 05/13/2021   SpO2 98%   Breastfeeding Yes   BMI 18.25 kg/m    Physical exam General: well appearing, NAD Cardiovascular: RRR, no murmurs Lungs: CTAB. Normal WOB Abdomen: soft, non-distended, non-tender Skin: warm, dry. No edema MSK: bilateral LE - Inspection: No gross deformity, no swelling, erythema, or ecchymosis b/l - Palpation: No TTP - ROM: Normal range of motion on Flexion, extension - Strength: Normal strength in all fields b/l - Neuro/vasc: NV intact distally b/l  ASSESSMENT/PLAN:   Anterior leg pain Patient presents with 3 days of bilateral anterior leg pain without inciting injury or trauma. No red flag symptoms. Physical exam unremarkable. Patient has a new baby she is lifting and I suspect maybe muscle tightness or strain from that. Provided patient with some exercises to do daily to strengthen quads. Encouraged full body exercising as well. Discussed alternating between ice and heat, and over the counter tylenol and ibuprofen for pain relief. Discussed return precautions if worsening or without improvement after 2 weeks of exercises and conservative management.   Cora Collum, DO [facility]

## 2021-07-11 NOTE — Patient Instructions (Signed)
It was great seeing you today!  You came in for bilateral anterior leg pain which I think is musculoskeletal. I recommend doing stretches and exercises daily to help strengthen your quads. I have included some exercises below. You can also use ice and heat alternating to help with the pain.  Take over-the-counter Tylenol or ibuprofen for pain relief. Return if your leg pain worsens or does not improve after 1-2 weeks of doing the exercises daily.   Feel free to call with any questions or concerns at any time, at [phone].   Take care,  Dr. Shary Key [facility]

## 2021-07-15 DIAGNOSIS — M79606 Pain in leg, unspecified: Secondary | ICD-10-CM

## 2021-07-15 HISTORY — DX: Pain in leg, unspecified: M79.606

## 2021-07-15 NOTE — Assessment & Plan Note (Signed)
Patient presents with 3 days of bilateral anterior leg pain without inciting injury or trauma. No red flag symptoms. Physical exam unremarkable. Patient has a new baby she is lifting and I suspect maybe muscle tightness or strain from that. Provided patient with some exercises to do daily to strengthen quads. Encouraged full body exercising as well. Discussed alternating between ice and heat, and over the counter tylenol and ibuprofen for pain relief. Discussed return precautions if worsening or without improvement after 2 weeks of exercises and conservative management.

## 2021-12-12 ENCOUNTER — Emergency Department (HOSPITAL_COMMUNITY): Payer: Medicaid Other

## 2021-12-12 ENCOUNTER — Other Ambulatory Visit: Payer: Self-pay

## 2021-12-12 ENCOUNTER — Encounter (HOSPITAL_COMMUNITY): Payer: Self-pay

## 2021-12-12 ENCOUNTER — Emergency Department (HOSPITAL_COMMUNITY)
Admission: EM | Admit: 2021-12-12 | Discharge: 2021-12-12 | Disposition: A | Discharge status: Home / Self Care | Payer: Medicaid Other | Attending: Emergency Medicine | Admitting: Emergency Medicine

## 2021-12-12 DIAGNOSIS — R0781 Pleurodynia: Secondary | ICD-10-CM | POA: Diagnosis not present

## 2021-12-12 DIAGNOSIS — R059 Cough, unspecified: Secondary | ICD-10-CM | POA: Insufficient documentation

## 2021-12-12 DIAGNOSIS — R0789 Other chest pain: Secondary | ICD-10-CM | POA: Diagnosis not present

## 2021-12-12 DIAGNOSIS — R0981 Nasal congestion: Secondary | ICD-10-CM | POA: Diagnosis present

## 2021-12-12 MED ORDER — DICLOFENAC SODIUM 1 % EX GEL: CUTANEOUS | 0 refills | Status: DC

## 2021-12-12 MED ORDER — CYCLOBENZAPRINE HCL 10 MG PO TABS: 10.0000 mg | ORAL_TABLET | Freq: Every day | ORAL | 0 refills | Status: DC

## 2021-12-12 MED ORDER — IBUPROFEN 100 MG/5ML PO SUSP
400.0000 mg | Freq: Once | ORAL | Status: DC
  Filled 2021-12-12: qty 20

## 2021-12-12 MED ORDER — IBUPROFEN 100 MG/5ML PO SUSP: 400.0000 mg | ORAL | 0 refills | Status: DC | PRN

## 2021-12-12 NOTE — ED Provider Triage Note (Signed)
Emergency Medicine Provider Triage Evaluation Note  Natalie Mason , a 21 y.o. female  was evaluated in triage.  Pt complains of right side rib cage pain that is worse with deep breathing and coughing.  States she had a cough that has mostly resolved.  Has had the pain for 2 weeks.  History significant for asthma.  Denies chest pain, shortness of breath, fever.   Review of Systems  Positive: See above  Negative: See above  Physical Exam  BP 117/67 (BP Location: Right Arm)   Pulse 76   Temp 98.6 F (37 C)   Resp 15   Ht 5\' 4"  (1.626 m)   Wt 48.1 kg   SpO2 99%   BMI 18.19 kg/m  Gen:   Awake, no distress   Resp:  Normal effort, lungs clear to auscultation bilaterally MSK:   Moves extremities without difficulty, right side rib cage pain, worse with deep inspiration and palpation   Medical Decision Making  Medically screening exam initiated at 1:13 PM.  Appropriate orders placed.  Saran Greidys Deland was informed that the remainder of the evaluation will be completed by another provider, this initial triage assessment does not replace that evaluation, and the importance of remaining in the ED until their evaluation is complete.     Patriciaann Clan R, Melton Alar 12/12/21 1315

## 2021-12-12 NOTE — Discharge Instructions (Addendum)
You were seen in the ER for chest wall pain.  The x-rays of your chest and ribs show no acute fracture or problems with your lung.  Based on your symptoms coming on after you had a cough for a couple of weeks, it is likely you have costochondritis, which is inflammation of the cartilage between your ribs.  This soreness can last for weeks and NSAIDs are the best treatment.  I am sending you home on ibuprofen and Voltaren gel to treat inflammation.  I am also giving you a prescription of a muscle relaxer you can take at bedtime to help with muscle spasms in your chest wall.  This medication can make you sleepy, please do not take it and drive.  I recommend following up with your PCP in the next week to reassess you.  Return to the ER if you develop new or worsening symptoms.

## 2021-12-12 NOTE — ED Provider Notes (Signed)
[facility] EMERGENCY DEPARTMENT Provider Note   CSN: [id number] Arrival date & time: 12/12/21  1208     History  Chief Complaint  Patient presents with   rib cage pain    Natalie Mason is a 21 y.o. female presents to the ER complaining of right-sided rib cage pain.  Patient states she had upper respiratory infection with severe cough over the last couple of weeks.  Patient states her cough has resolved but she is now having a right-sided rib cage pain that is worse with deep breathing.  Denies shortness of breath, palpitations, syncope, nausea, vomiting.     Home Medications Prior to Admission medications   Medication Sig Start Date End Date Taking? Authorizing Provider  cyclobenzaprine (FLEXERIL) 10 MG tablet Take 1 tablet (10 mg total) by mouth at bedtime. 12/12/21  Yes Lyndell Allaire R, PA  diclofenac Sodium (VOLTAREN) 1 % GEL Apply small amount of gel to area 3-4 times per day 12/12/21  Yes Leiani Enright R, PA  ibuprofen (ADVIL) 100 MG/5ML suspension Take 20 mLs (400 mg total) by mouth every 4 (four) hours as needed. 12/12/21  Yes Jaquez Farrington R, PA  acetaminophen (TYLENOL) 160 MG/5ML solution Take 20.3 mLs (650 mg total) by mouth every 4 (four) hours as needed (pain scale <4). 03/03/21   Allayne Stack, DO  ferrous sulfate 325 (65 FE) MG tablet Take 1 tablet (325 mg total) by mouth every other day. 03/04/21   Allayne Stack, DO  norethindrone (ORTHO MICRONOR) 0.35 MG tablet Take 1 tablet (0.35 mg total) by mouth daily. 05/01/21   Moses Manners, MD  polyethylene glycol powder (GLYCOLAX/MIRALAX) 17 GM/SCOOP powder Take 17 g by mouth 2 (two) times daily as needed. 05/01/21   Moses Manners, MD  Prenatal Multivit-Min-FA ([employer] PRENATAL GUMMY) 0.4 MG CHEW Chew 1 each by mouth daily. 05/01/21   Moses Manners, MD      Allergies    Penicillins    Review of Systems   Review of Systems  Constitutional:  Negative for fever.  Respiratory:  Negative  for cough, chest tightness, shortness of breath and wheezing.   Cardiovascular:  Positive for chest pain. Negative for palpitations.  Gastrointestinal:  Negative for nausea and vomiting.  Neurological:  Negative for syncope.    Physical Exam Updated Vital Signs BP 112/79   Pulse 98   Temp 98 F (36.7 C) (Oral)   Resp 16   Ht 5\' 4"  (1.626 m)   Wt 48.1 kg   SpO2 100%   BMI 18.19 kg/m  Physical Exam  ED Results / Procedures / Treatments   Labs (all labs ordered are listed, but only abnormal results are displayed) Labs Reviewed - No data to display  EKG None  Radiology DG Ribs Unilateral W/Chest Right  Result Date: 12/12/2021 CLINICAL DATA:  RIGHT anterior lower rib pain for 2 weeks, no injury, coughing before onset of pain EXAM: RIGHT RIBS AND CHEST - 3+ VIEW COMPARISON:  05/31/2016 FINDINGS: Normal heart size, mediastinal contours, and pulmonary vascularity. Lungs clear. No infiltrate, pleural effusion, or pneumothorax. Osseous mineralization normal. BB placed at site of symptoms lower RIGHT ribs. No rib fracture or bone destruction. IMPRESSION: Normal exam. Electronically Signed   By: 06/02/2016 M.D.   On: 12/12/2021 13:56    Procedures Procedures    Medications Ordered in ED Medications  ibuprofen (ADVIL) 100 MG/5ML suspension 400 mg (400 mg Oral Not Given 12/12/21 1724)    ED  Course/ Medical Decision Making/ A&P                           Medical Decision Making Amount and/or Complexity of Data Reviewed Radiology: ordered.   This patient presents to the ED with chief complaint(s) of right side chest wall pain. She had recent URI with severe cough.  She states her cough has resolved, but the chest pain has not improved and is worse with deep breathing.    The differential diagnosis includes pneumothorax, chest wall strain, costochondritis.  Low suspicion for pneumonia based on patient presentation - no fever, shortness of breath, cough, and pain is in the chest  wall/ribs   The initial plan is to obtain CXR; patient presents with normal vital signs, no fever, cough, or shortness of breath, therefore, I do not feel that lab work-up is warranted at this time.  Initial Assessment:   On exam, patient appears comfortable and is in no acute distress.  Lung sounds clear to auscultation bilaterally, no reduced tidal volume.  Patient has pain with deep inspiration.  Pain to right side lower ribs with palpation.  No crepitus appreciated.  No left side chest wall abnormalities.  Heart rate and rhythm normal.   Independent visualization and interpretation of imaging: I independently visualized the following imaging with scope of interpretation limited to determining acute life threatening conditions related to emergency care: CXR, which revealed no infiltrate, pneumothorax, or pleural effusion; no evidence of rib fracture  Treatment and Reassessment: Patient's pain has remained unchanged since evaluation in triage. Will treat patient in ED with ibuprofen to help with inflammation.   Other treatment options considered:   Patient was offered IM Toradol to help with inflammation and pain, but states she would rather do oral medication.   Disposition:   Patient's work-up and physical exam are consistent with chest wall pain, likely costochondritis from having URI with cough.  She has no dyspnea and is able to ambulate without difficulty or increased chest pain.  She is PERC negative.  Will send patient home with prescription for ibuprofen, flexeril, and voltaren gel to help with symptoms.  Recommended follow-up with PCP to revaluate or if symptoms do not improve with treatment.  Return precautions given.         Final Clinical Impression(s) / ED Diagnoses Final diagnoses:  Acute chest wall pain    Rx / DC Orders ED Discharge Orders          Ordered    diclofenac Sodium (VOLTAREN) 1 % GEL        12/12/21 1718    ibuprofen (ADVIL) 100 MG/5ML suspension   Every 4 hours PRN        12/12/21 1718    cyclobenzaprine (FLEXERIL) 10 MG tablet  Daily at bedtime        12/12/21 1718              Lenard Simmer, PA 12/12/21 1750    Jacalyn Lefevre, MD 12/12/21 1828

## 2021-12-12 NOTE — ED Triage Notes (Signed)
Pt arrived POV from home c/o right sided rib cage pain. Pt states it hurts to take a deep breathe and to cough.

## 2022-01-23 ENCOUNTER — Ambulatory Visit: Payer: Medicaid Other | Admitting: Student

## 2022-01-23 VITALS — BP 104/68 | HR 94 | Wt 110.0 lb

## 2022-01-23 DIAGNOSIS — Z7251 High risk heterosexual behavior: Secondary | ICD-10-CM

## 2022-01-23 DIAGNOSIS — R053 Chronic cough: Secondary | ICD-10-CM | POA: Diagnosis not present

## 2022-01-23 DIAGNOSIS — J069 Acute upper respiratory infection, unspecified: Secondary | ICD-10-CM | POA: Diagnosis not present

## 2022-01-23 HISTORY — DX: Chronic cough: R05.3

## 2022-01-23 HISTORY — DX: Acute upper respiratory infection, unspecified: J06.9

## 2022-01-23 LAB — POCT URINE PREGNANCY: Preg Test, Ur: NEGATIVE

## 2022-01-23 MED ORDER — CETIRIZINE HCL 10 MG PO TABS: 10.0000 mg | ORAL_TABLET | Freq: Every day | ORAL | 1 refills | Status: DC
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
## 2022-01-23 NOTE — Assessment & Plan Note (Signed)
Patient has been having chronic cough for the past year.  Most likely related to allergic symptoms versus environmental from her apartment.  She is a non-smoker.  She does not have any hemoptysis.  She does have weight loss in the past year however she was pregnant at the beginning of this year and the reason why her weight is down subsequently. -Not improving after Zyrtec to come back in for evaluation with PCP -Consider H2 blocker/acid reflux symptoms that could be causing this -Deferred chest x-ray today given no fevers/red flag symptoms but could consider this in future if continuing alongside potentially getting CBC with differential to see if patient has eosinophilia

## 2022-01-23 NOTE — Patient Instructions (Signed)
It was great to see you! Thank you for allowing me to participate in your care!   Our plans for today:  - I am going to start zyrtec for you, I would recommend flonase if you are able to do that -Chronic cough can be from many things, I think it is important to follow-up with your primary care provider after this acute illness has resolved  -I believe that you currently have a viral infection  Take care and seek immediate care sooner if you develop any concerns.  Levin Erp, MD

## 2022-01-23 NOTE — Assessment & Plan Note (Signed)
Likely viral URI given symptoms of cough and congestion for past 2 to 3 days.  I discussed my suggestions of starting Flonase but patient is not interested at all in doing a nasal spray.  He said I can send in an antihistamine for her to help with some of her nasal congestion. -zyrtec 10 mg daily

## 2022-06-08 ENCOUNTER — Encounter (HOSPITAL_COMMUNITY): Payer: Self-pay | Admitting: Emergency Medicine

## 2022-06-08 ENCOUNTER — Other Ambulatory Visit: Payer: Self-pay

## 2022-06-08 ENCOUNTER — Emergency Department (HOSPITAL_COMMUNITY)
Admission: EM | Admit: 2022-06-08 | Discharge: 2022-06-08 | Disposition: A | Discharge status: Home / Self Care | Payer: Medicaid Other | Attending: Emergency Medicine | Admitting: Emergency Medicine

## 2022-06-08 DIAGNOSIS — Z20822 Contact with and (suspected) exposure to covid-19: Secondary | ICD-10-CM | POA: Insufficient documentation

## 2022-06-08 DIAGNOSIS — H6692 Otitis media, unspecified, left ear: Secondary | ICD-10-CM | POA: Insufficient documentation

## 2022-06-08 DIAGNOSIS — H6690 Otitis media, unspecified, unspecified ear: Secondary | ICD-10-CM

## 2022-06-08 DIAGNOSIS — H9202 Otalgia, left ear: Secondary | ICD-10-CM | POA: Diagnosis present

## 2022-06-08 LAB — SARS CORONAVIRUS 2 BY RT PCR: SARS Coronavirus 2 by RT PCR: NEGATIVE

## 2022-06-08 MED ORDER — NEOMYCIN-POLYMYXIN-HC 3.5-10000-1 OT SUSP: 4.0000 [drp] | Freq: Three times a day (TID) | OTIC | 0 refills | Status: AC

## 2022-06-08 MED ORDER — ACETAMINOPHEN 160 MG/5ML PO SOLN
650.0000 mg | Freq: Once | ORAL | Status: AC
  Administered 2022-06-08: 650 mg via ORAL
  Filled 2022-06-08: qty 20.3

## 2022-06-08 MED ORDER — ACETAMINOPHEN 160 MG/5ML PO SOLN
325.0000 mg | Freq: Once | ORAL | Status: AC
  Administered 2022-06-08: 325 mg via ORAL
  Filled 2022-06-08: qty 20.3

## 2022-06-08 NOTE — ED Provider Notes (Signed)
[place] EMERGENCY DEPARTMENT AT [facility] Provider Note   CSN: [id number] Arrival date & time: 06/08/22  4098     History  Chief Complaint  Patient presents with   Otalgia    Natalie Mason is a 22 y.o. female.  Patient here with left ear pain for the last few days.  Nothing makes it worse or better.  Denies any Q-tips or foreign body.  Denies any other symptoms.  The history is provided by the patient.       Home Medications Prior to Admission medications   Medication Sig Start Date End Date Taking? Authorizing Provider  neomycin-polymyxin-hydrocortisone (CORTISPORIN) 3.5-10000-1 OTIC suspension Place 4 drops into the left ear 3 (three) times daily for 10 days. 06/08/22 06/18/22 Yes Addiel Mccardle, DO  acetaminophen (TYLENOL) 160 MG/5ML solution Take 20.3 mLs (650 mg total) by mouth every 4 (four) hours as needed (pain scale <4). 03/03/21   Allayne Stack, DO  cetirizine (ZYRTEC ALLERGY) 10 MG tablet Take 1 tablet (10 mg total) by mouth daily. 01/23/22   Levin Erp, MD  cyclobenzaprine (FLEXERIL) 10 MG tablet Take 1 tablet (10 mg total) by mouth at bedtime. 12/12/21   Clark, Meghan R, PA-C  diclofenac Sodium (VOLTAREN) 1 % GEL Apply small amount of gel to area 3-4 times per day 12/12/21   Melton Alar R, PA-C  ferrous sulfate 325 (65 FE) MG tablet Take 1 tablet (325 mg total) by mouth every other day. 03/04/21   Allayne Stack, DO  ibuprofen (ADVIL) 100 MG/5ML suspension Take 20 mLs (400 mg total) by mouth every 4 (four) hours as needed. 12/12/21   Clark, Meghan R, PA-C  norethindrone (ORTHO MICRONOR) 0.35 MG tablet Take 1 tablet (0.35 mg total) by mouth daily. 05/01/21   Moses Manners, MD  polyethylene glycol powder (GLYCOLAX/MIRALAX) 17 GM/SCOOP powder Take 17 g by mouth 2 (two) times daily as needed. 05/01/21   Moses Manners, MD  Prenatal Multivit-Min-FA ([employer] PRENATAL GUMMY) 0.4 MG CHEW Chew 1 each by mouth daily. 05/01/21   Moses Manners, MD       Allergies    Ibuprofen and Penicillins    Review of Systems   Review of Systems  Physical Exam Updated Vital Signs BP 123/79 (BP Location: Right Arm)   Pulse (!) 130   Temp 100 F (37.8 C) (Oral)   Resp 19   SpO2 100%  Physical Exam Vitals and nursing note reviewed.  Constitutional:      General: She is not in acute distress.    Appearance: She is well-developed.  HENT:     Head: Normocephalic and atraumatic.     Right Ear: Tympanic membrane normal.     Ears:     Comments: Left TM is red and bulging but there is no perforation    Nose: Nose normal.  Eyes:     Conjunctiva/sclera: Conjunctivae normal.  Cardiovascular:     Rate and Rhythm: Normal rate and regular rhythm.     Heart sounds: No murmur heard. Pulmonary:     Effort: Pulmonary effort is normal. No respiratory distress.     Breath sounds: Normal breath sounds.  Abdominal:     Palpations: Abdomen is soft.     Tenderness: There is no abdominal tenderness.  Musculoskeletal:        General: No swelling.     Cervical back: Neck supple.  Skin:    General: Skin is warm and dry.  Capillary Refill: Capillary refill takes less than 2 seconds.  Neurological:     Mental Status: She is alert.  Psychiatric:        Mood and Affect: Mood normal.     ED Results / Procedures / Treatments   Labs (all labs ordered are listed, but only abnormal results are displayed) Labs Reviewed  SARS CORONAVIRUS 2 BY RT PCR    EKG None  Radiology No results found.  Procedures Procedures    Medications Ordered in ED Medications  acetaminophen (TYLENOL) 160 MG/5ML solution 650 mg (650 mg Oral Given 06/08/22 0742)  acetaminophen (TYLENOL) 160 MG/5ML solution 325 mg (325 mg Oral Given 06/08/22 1100)    ED Course/ Medical Decision Making/ A&P                             Medical Decision Making Risk OTC drugs. Prescription drug management.   Natalie Mason is here with left ear pain.  She appears to have an  otitis media on exam.  Will treat with eardrops.  No concern for mastoiditis or other acute process.  Patient discharged in good condition.  This chart was dictated using voice recognition software.  Despite best efforts to proofread,  errors can occur which can change the documentation meaning.         Final Clinical Impression(s) / ED Diagnoses Final diagnoses:  Acute otitis media, unspecified otitis media type    Rx / DC Orders ED Discharge Orders          Ordered    neomycin-polymyxin-hydrocortisone (CORTISPORIN) 3.5-10000-1 OTIC suspension  3 times daily        06/08/22 1055              Sarepta, Laaibah Wartman, DO 06/08/22 1249

## 2022-06-08 NOTE — ED Triage Notes (Signed)
Pt reports left ear pain that started yesterday. Denies fevers at home.

## 2022-06-08 NOTE — Discharge Instructions (Signed)
Take antibiotic drops as prescribed.  Make sure you lie down for 10 to 15 minutes after putting the drops in the ear to make sure that antibiotics soaks into your eardrum.

## 2022-06-09 ENCOUNTER — Other Ambulatory Visit: Payer: Self-pay

## 2022-06-09 ENCOUNTER — Encounter (HOSPITAL_COMMUNITY): Payer: Self-pay

## 2022-06-09 ENCOUNTER — Emergency Department (HOSPITAL_COMMUNITY)
Admission: EM | Admit: 2022-06-09 | Discharge: 2022-06-09 | Disposition: A | Discharge status: Home / Self Care | Payer: Medicaid Other | Attending: Emergency Medicine | Admitting: Emergency Medicine

## 2022-06-09 DIAGNOSIS — H65192 Other acute nonsuppurative otitis media, left ear: Secondary | ICD-10-CM

## 2022-06-09 DIAGNOSIS — H9202 Otalgia, left ear: Secondary | ICD-10-CM | POA: Diagnosis present

## 2022-06-09 DIAGNOSIS — H7292 Unspecified perforation of tympanic membrane, left ear: Secondary | ICD-10-CM | POA: Diagnosis not present

## 2022-06-09 MED ORDER — OXYCODONE-ACETAMINOPHEN 5-325 MG PO TABS: 1.0000 | ORAL_TABLET | Freq: Four times a day (QID) | ORAL | 0 refills | Status: DC | PRN

## 2022-06-09 MED ORDER — AZITHROMYCIN 250 MG PO TABS
500.0000 mg | ORAL_TABLET | Freq: Once | ORAL | Status: AC
  Administered 2022-06-09: 500 mg via ORAL
  Filled 2022-06-09: qty 2

## 2022-06-09 MED ORDER — AZITHROMYCIN 250 MG PO TABS: 250.0000 mg | ORAL_TABLET | Freq: Every day | ORAL | 0 refills | Status: DC

## 2022-06-09 MED ORDER — OXYCODONE-ACETAMINOPHEN 5-325 MG PO TABS
1.0000 | ORAL_TABLET | Freq: Once | ORAL | Status: AC
  Administered 2022-06-09: 1 via ORAL
  Filled 2022-06-09: qty 1

## 2022-06-09 NOTE — ED Triage Notes (Signed)
Patient has had left ear pain for 2 days. Was prescribed ear drops. Now stated ear is bleeding. Trouble hearing out of ear.

## 2022-06-09 NOTE — ED Provider Notes (Signed)
[place] EMERGENCY DEPARTMENT AT [facility] Provider Note   CSN: [id number] Arrival date & time: 06/09/22  1554    History  Chief Complaint  Patient presents with   Otalgia    Natalie Mason is a 22 y.o. female without chronic medical problems here for evaluation of left ear pain and bleeding.  Was seen here 2 days ago diagnosed with otitis.  Prescribed drops.  Patient states today she has had muffled hearing, ear pain and noted some blood from her left ear.  She denies any recent trauma, Q-tip use.  No headache, syncope.  No chest pain, shortness of breath.  Pain is aching in nature.  HPI     Home Medications Prior to Admission medications   Medication Sig Start Date End Date Taking? Authorizing Provider  azithromycin (ZITHROMAX) 250 MG tablet Take 1 tablet (250 mg total) by mouth daily. Take first 2 tablets together, then 1 every day until finished. 06/09/22  Yes Sonya Pucci A, PA-C  oxyCODONE-acetaminophen (PERCOCET/ROXICET) 5-325 MG tablet Take 1 tablet by mouth every 6 (six) hours as needed for severe pain. 06/09/22  Yes Debera Sterba A, PA-C  acetaminophen (TYLENOL) 160 MG/5ML solution Take 20.3 mLs (650 mg total) by mouth every 4 (four) hours as needed (pain scale <4). 03/03/21   Allayne Stack, DO  cetirizine (ZYRTEC ALLERGY) 10 MG tablet Take 1 tablet (10 mg total) by mouth daily. 01/23/22   Levin Erp, MD  cyclobenzaprine (FLEXERIL) 10 MG tablet Take 1 tablet (10 mg total) by mouth at bedtime. 12/12/21   Clark, Meghan R, PA-C  diclofenac Sodium (VOLTAREN) 1 % GEL Apply small amount of gel to area 3-4 times per day 12/12/21   Melton Alar R, PA-C  ferrous sulfate 325 (65 FE) MG tablet Take 1 tablet (325 mg total) by mouth every other day. 03/04/21   Allayne Stack, DO  ibuprofen (ADVIL) 100 MG/5ML suspension Take 20 mLs (400 mg total) by mouth every 4 (four) hours as needed. 12/12/21   Clark, Meghan R, PA-C  neomycin-polymyxin-hydrocortisone  (CORTISPORIN) 3.5-10000-1 OTIC suspension Place 4 drops into the left ear 3 (three) times daily for 10 days. 06/08/22 06/18/22  Curatolo, Adam, DO  norethindrone (ORTHO MICRONOR) 0.35 MG tablet Take 1 tablet (0.35 mg total) by mouth daily. 05/01/21   Moses Manners, MD  polyethylene glycol powder (GLYCOLAX/MIRALAX) 17 GM/SCOOP powder Take 17 g by mouth 2 (two) times daily as needed. 05/01/21   Moses Manners, MD  Prenatal Multivit-Min-FA ([employer] PRENATAL GUMMY) 0.4 MG CHEW Chew 1 each by mouth daily. 05/01/21   Moses Manners, MD      Allergies    Ibuprofen and Penicillins    Review of Systems   Review of Systems  Constitutional: Negative.   HENT:  Positive for ear discharge and ear pain. Negative for congestion, dental problem, drooling, nosebleeds, postnasal drip and rhinorrhea.   Respiratory: Negative.    Cardiovascular: Negative.   Gastrointestinal: Negative.   Genitourinary: Negative.   Musculoskeletal: Negative.   Skin: Negative.   Neurological: Negative.   All other systems reviewed and are negative.   Physical Exam Updated Vital Signs BP 133/83 (BP Location: Right Arm)   Pulse 95   Temp 99.1 F (37.3 C) (Oral)   Resp 18   Ht 5' 4.5" (1.638 m)   Wt 51.3 kg   SpO2 100%   BMI 19.10 kg/m  Physical Exam Vitals and nursing note reviewed.  Constitutional:  General: She is not in acute distress.    Appearance: She is well-developed. She is not ill-appearing.  HENT:     Head: Normocephalic and atraumatic.     Jaw: There is normal jaw occlusion.     Right Ear: Tympanic membrane, ear canal and external ear normal.     Left Ear: Decreased hearing noted. No tenderness.  No middle ear effusion. There is no impacted cerumen. No foreign body. No mastoid tenderness. No PE tube. No hemotympanum. Tympanic membrane is perforated.     Ears:     Comments: Frothy yellow discharge left mid ear canal, blood streaked.  Pinhole perforated TM however difficult to see overall due to  discharge in ear canal.  Nontender mastoid bilaterally, right TM clear, no discharge    Nose: Nose normal.     Mouth/Throat:     Mouth: Mucous membranes are moist.  Eyes:     Pupils: Pupils are equal, round, and reactive to light.  Cardiovascular:     Rate and Rhythm: Normal rate.  Pulmonary:     Effort: No respiratory distress.  Abdominal:     General: There is no distension.  Musculoskeletal:        General: Normal range of motion.     Cervical back: Normal range of motion.  Skin:    General: Skin is warm and dry.  Neurological:     General: No focal deficit present.     Mental Status: She is alert.  Psychiatric:        Mood and Affect: Mood normal.     ED Results / Procedures / Treatments   Labs (all labs ordered are listed, but only abnormal results are displayed) Labs Reviewed - No data to display  EKG None  Radiology No results found.  Procedures Procedures    Medications Ordered in ED Medications  azithromycin (ZITHROMAX) tablet 500 mg (has no administration in time range)  oxyCODONE-acetaminophen (PERCOCET/ROXICET) 5-325 MG per tablet 1 tablet (has no administration in time range)    ED Course/ Medical Decision Making/ A&P    22 year old here for evaluation left ear pain.  Seen 2 days ago diagnosed with otitis.  She denies any recent injury or trauma.  Does not use Q-tips.  No headache, syncope, tinnitus however does have decreased hearing to the left ear.  Noted worsening pain today and then noted drainage and bleeding from her left ear.  She has no evidence of mastoiditis.  She has a nonfocal neuroexam without deficits.  Right TM clear, left with frothy yellow, blood-streaked drainage with perforated left TM.  No systemic symptoms.  She does appear mildly anxious.  Will give first dose of antibiotics here. She has allergy to penicillins (anaphylaxis to patient)  Low suspicion for deep space infection, CSF leak, traumatic injury.  DC home with symptomatic  management follow-up with ENT  The patient has been appropriately medically screened and/or stabilized in the ED. I have low suspicion for any other emergent medical condition which would require further screening, evaluation or treatment in the ED or require inpatient management.  Patient is hemodynamically stable and in no acute distress.  Patient able to ambulate in department prior to ED.  Evaluation does not show acute pathology that would require ongoing or additional emergent interventions while in the emergency department or further inpatient treatment.  I have discussed the diagnosis with the patient and answered all questions.  Pain is been managed while in the emergency department and patient has no further  complaints prior to discharge.  Patient is comfortable with plan discussed in room and is stable for discharge at this time.  I have discussed strict return precautions for returning to the emergency department.  Patient was encouraged to follow-up with PCP/specialist refer to at discharge.                             Medical Decision Making Amount and/or Complexity of Data Reviewed Independent Historian: parent External Data Reviewed: notes.  Risk OTC drugs. Prescription drug management. Parenteral controlled substances. Decision regarding hospitalization. Diagnosis or treatment significantly limited by social determinants of health.          Final Clinical Impression(s) / ED Diagnoses Final diagnoses:  Other non-recurrent acute nonsuppurative otitis media of left ear  Perforation of left tympanic membrane    Rx / DC Orders ED Discharge Orders          Ordered    azithromycin (ZITHROMAX) 250 MG tablet  Daily        06/09/22 1631    oxyCODONE-acetaminophen (PERCOCET/ROXICET) 5-325 MG tablet  Every 6 hours PRN        06/09/22 1631              Edmund Rick A, PA-C 06/09/22 1653    Derwood Kaplan, MD 06/12/22 1401

## 2022-06-09 NOTE — Discharge Instructions (Addendum)
Take the medications as prescribed.  Please use caution with the Percocet as this is a controlled substance.  It can make you sleepy and does have the addictive potential.  This medication does contain Tylenol.  Do not take additional Tylenol containing products with it.  You may take ibuprofen as long as you are not pregnant.  You had your first dose of pain medicine as well as antibiotic here in the emergency department.  Follow-up with the ear nose and throat provider.  I have listed number to on your discharge paperwork.  Call to schedule appointment.  Return for new or worsening symptoms otherwise follow-up with primary care provider

## 2022-06-14 ENCOUNTER — Emergency Department (HOSPITAL_COMMUNITY)
Admission: EM | Admit: 2022-06-14 | Discharge: 2022-06-14 | Disposition: A | Discharge status: Home / Self Care | Payer: Medicaid Other | Attending: Emergency Medicine | Admitting: Emergency Medicine

## 2022-06-14 ENCOUNTER — Other Ambulatory Visit: Payer: Self-pay

## 2022-06-14 DIAGNOSIS — H66002 Acute suppurative otitis media without spontaneous rupture of ear drum, left ear: Secondary | ICD-10-CM | POA: Diagnosis not present

## 2022-06-14 DIAGNOSIS — H9202 Otalgia, left ear: Secondary | ICD-10-CM | POA: Diagnosis present

## 2022-06-14 DIAGNOSIS — H7292 Unspecified perforation of tympanic membrane, left ear: Secondary | ICD-10-CM | POA: Diagnosis not present

## 2022-06-14 DIAGNOSIS — H66012 Acute suppurative otitis media with spontaneous rupture of ear drum, left ear: Secondary | ICD-10-CM | POA: Diagnosis not present

## 2022-06-14 MED ORDER — CIPROFLOXACIN-DEXAMETHASONE 0.3-0.1 % OT SUSP: 4.0000 [drp] | Freq: Two times a day (BID) | OTIC | 0 refills | Status: AC

## 2022-06-14 MED ORDER — ACETAMINOPHEN 500 MG PO TABS
1000.0000 mg | ORAL_TABLET | Freq: Once | ORAL | Status: AC
  Administered 2022-06-14: 1000 mg via ORAL
  Filled 2022-06-14: qty 2

## 2022-06-14 NOTE — ED Triage Notes (Signed)
Patient presents with left ear pain for about 1.5 weeks.  She was seen before and was told there was a "hole in her eardrum." Patient was given a referral to the ENT but states she was not able to get in touch with them. Patient states that the pain worse today.

## 2022-06-14 NOTE — Discharge Instructions (Addendum)
It was a pleasure taking care of you today!   You will be sent a prescription for ciprodex, take as prescribed. At home you may take over the counter tylenol as needed for your symptoms. Attached is information for the ENT specialist, call tomorrow and set up a follow up appointment regarding todays ED visit. Return to the ED if you are experiencing increasing/worsening symptoms.

## 2022-06-14 NOTE — ED Provider Notes (Signed)
[place] EMERGENCY DEPARTMENT AT [facility] Provider Note   CSN: [id number] Arrival date & time: 06/14/22  1113     History  Chief Complaint  Patient presents with   Otalgia    Left ear    Natalie Mason is a 22 y.o. female who presents emergency department with concerns for left ear pain onset 1.5 weeks.  Has been evaluated multiple times for her symptoms within the past week.  Was given information for ENT specialist however was unable to get in touch with them to set up a follow-up appointment.  Has associated left ear drainage.  Has tried over-the-counter Tylenol at home for symptoms.  Notes that she completed the entire course of the antibiotic as prescribed to her.  Denies rhinorrhea, nasal congestion, sore throat, cough.  The history is provided by the patient. No language interpreter was used.       Home Medications Prior to Admission medications   Medication Sig Start Date End Date Taking? Authorizing Provider  ciprofloxacin-dexamethasone (CIPRODEX) OTIC suspension Place 4 drops into the left ear 2 (two) times daily for 10 days. 06/14/22 06/24/22 Yes Breyson Kelm A, PA-C  acetaminophen (TYLENOL) 160 MG/5ML solution Take 20.3 mLs (650 mg total) by mouth every 4 (four) hours as needed (pain scale <4). 03/03/21   Allayne Stack, DO  azithromycin (ZITHROMAX) 250 MG tablet Take 1 tablet (250 mg total) by mouth daily. Take first 2 tablets together, then 1 every day until finished. 06/09/22   Henderly, Britni A, PA-C  cetirizine (ZYRTEC ALLERGY) 10 MG tablet Take 1 tablet (10 mg total) by mouth daily. 01/23/22   Levin Erp, MD  cyclobenzaprine (FLEXERIL) 10 MG tablet Take 1 tablet (10 mg total) by mouth at bedtime. 12/12/21   Clark, Meghan R, PA-C  diclofenac Sodium (VOLTAREN) 1 % GEL Apply small amount of gel to area 3-4 times per day 12/12/21   Melton Alar R, PA-C  ferrous sulfate 325 (65 FE) MG tablet Take 1 tablet (325 mg total) by mouth every other day.  03/04/21   Allayne Stack, DO  ibuprofen (ADVIL) 100 MG/5ML suspension Take 20 mLs (400 mg total) by mouth every 4 (four) hours as needed. 12/12/21   Clark, Meghan R, PA-C  neomycin-polymyxin-hydrocortisone (CORTISPORIN) 3.5-10000-1 OTIC suspension Place 4 drops into the left ear 3 (three) times daily for 10 days. 06/08/22 06/18/22  Curatolo, Adam, DO  norethindrone (ORTHO MICRONOR) 0.35 MG tablet Take 1 tablet (0.35 mg total) by mouth daily. 05/01/21   Moses Manners, MD  oxyCODONE-acetaminophen (PERCOCET/ROXICET) 5-325 MG tablet Take 1 tablet by mouth every 6 (six) hours as needed for severe pain. 06/09/22   Henderly, Britni A, PA-C  polyethylene glycol powder (GLYCOLAX/MIRALAX) 17 GM/SCOOP powder Take 17 g by mouth 2 (two) times daily as needed. 05/01/21   Moses Manners, MD  Prenatal Multivit-Min-FA ([employer] PRENATAL GUMMY) 0.4 MG CHEW Chew 1 each by mouth daily. 05/01/21   Moses Manners, MD      Allergies    Ibuprofen and Penicillins    Review of Systems   Review of Systems  HENT:  Positive for ear pain.   All other systems reviewed and are negative.   Physical Exam Updated Vital Signs BP 105/86   Pulse (!) 114   Temp 98.4 F (36.9 C) (Oral)   Resp 16   Ht 5\' 4"  (1.626 m)   Wt 47.6 kg   LMP 06/11/2022 (Exact Date)   SpO2 99%  BMI 18.02 kg/m  Physical Exam Vitals and nursing note reviewed.  Constitutional:      General: She is not in acute distress.    Appearance: Normal appearance.  HENT:     Right Ear: Tympanic membrane, ear canal and external ear normal.     Left Ear: External ear normal. Drainage and tenderness present. No foreign body. No mastoid tenderness. Tympanic membrane is perforated.     Ears:     Comments: Yellow discharge left mid ear canal.  Pinhole perforated TM however difficult to see overall due to discharge in ear canal.  Nontender mastoid bilaterally, right TM clear, no discharge Eyes:     General: No scleral icterus.    Extraocular Movements:  Extraocular movements intact.  Cardiovascular:     Rate and Rhythm: Normal rate.  Pulmonary:     Effort: Pulmonary effort is normal. No respiratory distress.  Abdominal:     Palpations: Abdomen is soft. There is no mass.     Tenderness: There is no abdominal tenderness.  Musculoskeletal:        General: Normal range of motion.     Cervical back: Neck supple.  Skin:    General: Skin is warm and dry.     Findings: No rash.  Neurological:     Mental Status: She is alert.     Sensory: Sensation is intact.     Motor: Motor function is intact.  Psychiatric:        Behavior: Behavior normal.     ED Results / Procedures / Treatments   Labs (all labs ordered are listed, but only abnormal results are displayed) Labs Reviewed - No data to display  EKG None  Radiology No results found.  Procedures Procedures    Medications Ordered in ED Medications  acetaminophen (TYLENOL) tablet 1,000 mg (1,000 mg Oral Given 06/14/22 1258)    ED Course/ Medical Decision Making/ A&P Clinical Course as of 06/14/22 1544  Sun Jun 14, 2022  1300 Consult with ENT specialist, Dr. Marene Lenz who recommends Ciprodex 4 drops BID x 10 days and call office tomorrow for follow up  [SB]  1301 Ciproedx 4 drops bid x 10 days [SB]  1331 Discussed with patient recommendations as per ENT, patient agreeable at this time. Discussed discharge treatment plan. Answered all available questions. Pt appears safe for discharge at this time.  [SB]    Clinical Course User Index [SB] Lucy Woolever A, PA-C                             Medical Decision Making Risk OTC drugs. Prescription drug management.   Patient presents with left ear pain onset 1.5 weeks. Denies sick contacts.  Vital signs patient afebrile.  On exam patient with Yellow discharge left mid ear canal.  Pinhole perforated TM however difficult to see overall due to discharge in ear canal.  Nontender mastoid bilaterally, right TM clear, no discharge.  Differential diagnosis includes otitis media, otitis externa, acute mastoiditis, meningitis.    Additional history obtained:  External records from outside source obtained and reviewed including: Patient was evaluated in the ED on 06/08/2022 and treated with Cortisporin drops.  She was also evaluated in the ED on 06/09/2022 and treated with azithromycin p.o.   Medications:  I ordered medication including tylenol for symptom management I have reviewed the patients home medicines and have made adjustments as needed   Consultations: I requested consultation with the ENT, Dr.  Skotnicki and discussed lab and imaging findings as well as pertinent plan - they recommend: Ciprodex and follow-up in the office   Disposition: Patient presentation suspicious for otitis media with TM perforation.  Doubt otitis externa, acute mastoiditis, meningitis at this time. After consideration of the diagnostic results and the patients response to treatment, I feel that the patient would benefit from Discharge home.  Patient sent with a prescription for Ciprodex.  Advised patient close follow-up with the ENT specialist and to call their office tomorrow to set up a follow-up appointment for this week.  Work note provided.  Advised patient to follow-up with primary care provider.  Supportive care measures and strict return precautions discussed with patient at bedside. Pt acknowledges and verbalizes understanding. Pt appears safe for discharge. Follow up as indicated in discharge paperwork.    This chart was dictated using voice recognition software, Dragon. Despite the best efforts of this provider to proofread and correct errors, errors may still occur which can change documentation meaning.   Final Clinical Impression(s) / ED Diagnoses Final diagnoses:  Perforation of left tympanic membrane  Non-recurrent acute suppurative otitis media of left ear with spontaneous rupture of tympanic membrane    Rx / DC Orders ED  Discharge Orders          Ordered    ciprofloxacin-dexamethasone (CIPRODEX) OTIC suspension  2 times daily        06/14/22 1329              Natalie Mason A, PA-C 06/14/22 1547    Wynetta Fines, MD 06/20/22 1052

## 2022-06-16 DIAGNOSIS — H9212 Otorrhea, left ear: Secondary | ICD-10-CM | POA: Diagnosis not present

## 2022-06-23 DIAGNOSIS — H9212 Otorrhea, left ear: Secondary | ICD-10-CM | POA: Diagnosis not present

## 2022-07-13 ENCOUNTER — Encounter: Payer: Self-pay | Admitting: Family Medicine

## 2022-07-13 ENCOUNTER — Ambulatory Visit: Payer: Medicaid Other | Admitting: Family Medicine

## 2022-07-13 VITALS — BP 97/61 | HR 91 | Ht 64.0 in | Wt 108.8 lb

## 2022-07-13 DIAGNOSIS — H6592 Unspecified nonsuppurative otitis media, left ear: Secondary | ICD-10-CM | POA: Diagnosis not present

## 2022-07-13 NOTE — Patient Instructions (Addendum)
It was nice seeing you today!  Make sure to keep your follow-up appointment with ENT as scheduled.  Stay well, Littie Deeds, MD [facility] [phone]  --  Make sure to check out at the front desk before you leave today.  Please arrive at least 15 minutes prior to your scheduled appointments.  If you had blood work today, I will send you a MyChart message or a letter if results are normal. Otherwise, I will give you a call.  If you had a referral placed, they will call you to set up an appointment. Please give [place] a call if you don't hear back in the next 2 weeks.  If you need additional refills before your next appointment, please call your pharmacy first.

## 2022-07-13 NOTE — Progress Notes (Signed)
    SUBJECTIVE:   CHIEF COMPLAINT / HPI:    Patient was seen in the ED 1 month ago on 5/7 for acute otitis media in the left ear as well as perforation of the left TM.  She was evaluated again in the ED on 5/12 for perforation of the left TM.  ENT was consulted, recommended treatment with Ciprodex and outpatient ENT follow-up.  Reports decreased hearing out of the left ear that started 5 weeks ago. Sounds muffled. Reports ear pain and drainage did improve after treatment for AOM but is still having a little ear discomfort. She went for ENT follow-up appointment about 2 weeks ago, reports that she was treated with an additional course of oral antibiotics which she has finished. She reports no improvement in her hearing.  No problems in the right ear. She has follow-up with ENT in 10 days.   PERTINENT  PMH / PSH:  No prior history of AOM in childhood per patient  Patient Care Team: Sabino Dick, DO as PCP - General (Family Medicine)   OBJECTIVE:   BP 97/61   Pulse 91   Ht 5\' 4"  (1.626 m)   Wt 108 lb 12.8 oz (49.4 kg)   LMP 06/11/2022 (Exact Date)   SpO2 98%   BMI 18.68 kg/m   Physical Exam Constitutional:      General: She is not in acute distress. HENT:     Head: Normocephalic and atraumatic.     Right Ear: Tympanic membrane normal.     Left Ear: A middle ear effusion is present. Tympanic membrane is scarred.  Cardiovascular:     Rate and Rhythm: Normal rate.  Pulmonary:     Effort: Pulmonary effort is normal. No respiratory distress.  Musculoskeletal:     Cervical back: Neck supple.  Neurological:     Mental Status: She is alert.         01/23/2022   10:10 AM  Depression screen PHQ 2/9  Decreased Interest 0  Down, Depressed, Hopeless 0  PHQ - 2 Score 0  Altered sleeping 1  Tired, decreased energy 0  Change in appetite 0  Feeling bad or failure about yourself  0  Trouble concentrating 0  Moving slowly or fidgety/restless 0  Suicidal thoughts 0   PHQ-9 Score 1       {Show previous vital signs (optional):23777}    ASSESSMENT/PLAN:   1. Fluid level behind tympanic membrane of left ear Patient presenting with continued hearing loss in the left ear which is likely result of AOM of the left ear with TM rupture.  On exam today, she has some middle ear effusion and some scarring likely sequela of prior infection.  Advised that she should follow-up with ENT as scheduled.  Return if symptoms worsen or fail to improve.   Littie Deeds, MD [facility]

## 2022-07-23 DIAGNOSIS — H9122 Sudden idiopathic hearing loss, left ear: Secondary | ICD-10-CM | POA: Diagnosis not present

## 2022-07-23 DIAGNOSIS — H6122 Impacted cerumen, left ear: Secondary | ICD-10-CM | POA: Diagnosis not present

## 2022-08-11 ENCOUNTER — Other Ambulatory Visit: Payer: Self-pay | Admitting: *Deleted

## 2022-08-12 ENCOUNTER — Encounter: Payer: Self-pay | Admitting: Family Medicine

## 2022-08-12 MED ORDER — NORETHINDRONE 0.35 MG PO TABS: 1.0000 | ORAL_TABLET | Freq: Every day | ORAL | 11 refills | Status: DC

## 2023-02-03 NOTE — L&D Delivery Note (Signed)
 Delivery Note   Geena Evangelynn Lochridge is a 23 y.o. [id number] at [redacted]w[redacted]d Estimated Date of Delivery: 11/03/23  PRE-OPERATIVE DIAGNOSIS:  1) [redacted]w[redacted]d pregnancy.  2) s/p unplanned home birth  POST-OPERATIVE DIAGNOSIS:  1) [redacted]w[redacted]d pregnancy s/p Vaginal, Spontaneous at home  Delivery Type: Vaginal, Spontaneous   Delivery Anesthesia: None  Labor Complications: Unplanned, unassisted homebirth    ESTIMATED BLOOD LOSS: 200 ml    FINDINGS:   Information for the patient's newborn:  Vincie, Linn [[id number]]  Live born female  Birth Weight: 7 lb 10.4 oz (3470 g) APGAR: ,   Newborn Delivery   Birth date/time: 11/05/2023 17:30:00 Delivery type: Vaginal, Spontaneous      SPECIMENS:   PLACENTA:   Appearance: Intact   Removal: Spontaneous     Disposition: discarded  Cord Blood: Unable to collect  DISPOSITION:  Infant left in stable condition in the delivery room, with L&D personnel and mother,  NARRATIVE SUMMARY: Labor course:  Mckenzey Parcell is a H2E5965 at [redacted]w[redacted]d who presented to Labor & Delivery having had an unplanned unassisted birth at home this afternoon 1730. She's been having waxing and waning contractions for a few days. She lost her mucous plug this afternoon and called labor and delivery to report that. She was advised to call her provider. Her baby was born at 37 this evening. EMS arrived and assisted with placental delivery around 1740. The infant was transported to the hospital with the patient. Pt denies any hx of PPH, or prior delivery complications. She has had mild anemia this pregnancy and has been taking iron  supplements.    A perineal and vaginal examination was performed. Episiotomy/Lacerations: None Approximately clots were expressed with fundal massage when I assessed the patient. Pitocin  was added to IV fluid to ensure good uterine tone.  Tolerated well. Mother and baby left in stable condition.  Lauraine Lakes, CNM 11/06/2023 9:57 AM

## 2023-03-15 ENCOUNTER — Ambulatory Visit (LOCAL_COMMUNITY_HEALTH_CENTER): Payer: Medicaid Other

## 2023-03-15 VITALS — BP 141/57 | Ht 64.0 in | Wt 120.0 lb

## 2023-03-15 DIAGNOSIS — Z3201 Encounter for pregnancy test, result positive: Secondary | ICD-10-CM

## 2023-03-15 DIAGNOSIS — Z309 Encounter for contraceptive management, unspecified: Secondary | ICD-10-CM

## 2023-03-15 LAB — PREGNANCY, URINE: Preg Test, Ur: POSITIVE — AB

## 2023-03-15 MED ORDER — PRENATAL VITAMINS 28-0.8 MG PO TABS: 28.0000 mg | ORAL_TABLET | Freq: Every day | ORAL | Status: AC

## 2023-03-15 NOTE — Progress Notes (Signed)
 UPT positive.  See flowsheet.  Positive pregnancy packet given and reviewed with pt.  Plans prenatal care at [facility].  Consulted with E. Sciora CNM re: pt reports elevated Bp with one or two of her previous pregnancies but no medication and they induced labor.  Pt okay to be followed in our prenatal clinic per CNM.  The patient was dispensed prenatal vitamins today. I provided counseling today regarding the medication. Patient given the opportunity to ask questions. Questions answered.   After visit pt saw clerk to schedule prenatal appt.     Macon Scala, RN

## 2023-03-29 ENCOUNTER — Ambulatory Visit: Payer: Medicaid Other | Admitting: Family Medicine

## 2023-03-29 ENCOUNTER — Encounter: Payer: Self-pay | Admitting: Family Medicine

## 2023-03-29 VITALS — BP 124/64 | HR 96 | Temp 97.2°F | Wt 115.0 lb

## 2023-03-29 DIAGNOSIS — Z8759 Personal history of other complications of pregnancy, childbirth and the puerperium: Secondary | ICD-10-CM | POA: Diagnosis not present

## 2023-03-29 DIAGNOSIS — Z3402 Encounter for supervision of normal first pregnancy, second trimester: Secondary | ICD-10-CM | POA: Diagnosis not present

## 2023-03-29 DIAGNOSIS — Z3401 Encounter for supervision of normal first pregnancy, first trimester: Secondary | ICD-10-CM | POA: Insufficient documentation

## 2023-03-29 DIAGNOSIS — Z3A14 14 weeks gestation of pregnancy: Secondary | ICD-10-CM

## 2023-03-29 DIAGNOSIS — Z3480 Encounter for supervision of other normal pregnancy, unspecified trimester: Secondary | ICD-10-CM | POA: Insufficient documentation

## 2023-03-29 DIAGNOSIS — Z3481 Encounter for supervision of other normal pregnancy, first trimester: Secondary | ICD-10-CM | POA: Diagnosis not present

## 2023-03-29 DIAGNOSIS — R87610 Atypical squamous cells of undetermined significance on cytologic smear of cervix (ASC-US): Secondary | ICD-10-CM | POA: Diagnosis not present

## 2023-03-29 LAB — WET PREP FOR TRICH, YEAST, CLUE
Trichomonas Exam: NEGATIVE
Yeast Exam: NEGATIVE

## 2023-03-29 LAB — HEMOGLOBIN, FINGERSTICK: Hemoglobin: 11.3 g/dL (ref 11.1–15.9)

## 2023-03-29 MED ORDER — DOXYLAMINE-PYRIDOXINE 10-10 MG PO TBEC: DELAYED_RELEASE_TABLET | ORAL | 2 refills | Status: DC

## 2023-03-29 NOTE — Progress Notes (Addendum)
 Presents for initiation of prenatal care and denies care thus far in pregnancy. Denies international travel. Desires MaterniT-21 testing today. Client's demographic information verified as was contact information. Reports has been living in [place] and recently moved to [place]. ROI for PAP reports from [facility] signed Counseled regarding Centering Pregnancy. Centering Pregnancy card and schedule of appts given to client. RN portion of blue Centering form completed Client states may be interested. (Employed 4 PM - 12 MN at [address] [employer]. Mother may be available for keeping 2 year ol son during Centering appts). Centering room tour completed by Jeneen Montgomery, Centering Pregnancy Coordinator. Jossie Ng, RN ROI for PAP reports faxed with confirmation received. Per client, PCP has always been [facility] - even as a child. Jossie Ng, RN Wet prep reviewed and hgb = 11.2. No treatment indicated per standing order. 4 week [facility] RV appt scheduled and reminder card given.Client states will talke with Centering with her partner. Client received a text from her grandmother during appt today that her uncle passed away this am. Jossie Ng, RN

## 2023-03-29 NOTE — Progress Notes (Addendum)
 [facility] [address], [address] [place] [address] Main phone: [phone]  Initial Prenatal Visit  Subjective:  Natalie Mason is a 23 y.o. [id number] at [redacted]w[redacted]d being seen today to start prenatal care at the [facility]. She is currently monitored for the following issues for this low-risk pregnancy:   Patient Active Problem List   Diagnosis Date Noted   Encounter for supervision of normal first pregnancy in first trimester 03/29/2023   Iron deficiency anemia 02/10/2021   ASCUS with positive high risk HPV cervical 02/10/2021   History of gestational hypertension 10/23/2018   Patient reports no complaints.  Contractions: Not present. Vag. Bleeding: None.  Movement: Present (Flutters). Denies leaking of fluid.   Symptom review and family concerns Any questions or concerns today: None Nausea or vomiting: Lots of nausea, is keeping food and fluids down Pelvic pain: "Here and there, not too much" Vaginal bleeding: None How do you feel about being pregnant: Feels good about it, partner Sharia Reeve feels nervous because this is first child.  Was pregnancy planned: No  Dating LMP: 12/19/22 Reliable? Yes Any US performed already? No  Past history   Surgical history: Leg surgery, no abdominal or pelvic surgeries Tobacco: None ETOH: None, occasional before she found out she was pregnant  Drugs: None  Indications for ASA therapy One of the following: Previous pregnancy with preeclampsia, especially early onset and with an adverse outcome No  Multifetal gestation No  Chronic hypertension No  Type 1 or 2 diabetes mellitus No  Chronic kidney disease No  Autoimmune disease (antiphospholipid syndrome, systemic lupus erythematosus) No   Two or more of the following: Nulliparity  No  Obesity (body mass index >30 kg/m2) No  Family history of preeclampsia in mother or sister No  Age >=35 years No   Sociodemographic characteristics (African American race, low socioeconomic level) Yes  Personal risk factors (eg, previous pregnancy with low birth weight or small for gestational age infant, previous adverse pregnancy outcome [eg, stillbirth], interval >10 years between pregnancies) No   The following portions of the patient's history were reviewed and updated as appropriate: allergies, current medications, past family history, past medical history, past social history, past surgical history and problem list. Problem list updated.  Objective:   Vitals:   03/29/23 0854  BP: 124/64  Pulse: 96  Temp: (!) 97.2 F (36.2 C)  Weight: 115 lb (52.2 kg)   Fetal Status: Fetal Heart Rate (bpm): 160   Movement: Present (Flutters)     Physical Exam Vitals and nursing note reviewed. Exam conducted with a chaperone present.  Constitutional:      General: She is not in acute distress.    Appearance: Normal appearance. She is well-developed. She is not toxic-appearing.  HENT:     Head: Normocephalic and atraumatic.     Right Ear: External ear normal.     Left Ear: External ear normal.     Mouth/Throat:     Lips: Pink.     Mouth: Mucous membranes are moist.     Dentition: Normal dentition. No dental caries.     Pharynx: Oropharynx is clear. Uvula midline.     Comments: Dentition: intact Eyes:     General: No scleral icterus.       Right eye: No discharge.        Left eye: No discharge.     Conjunctiva/sclera: Conjunctivae normal.  Neck:     Thyroid: No thyroid mass  or thyromegaly.  Cardiovascular:     Comments: Extremities are warm and well perfused Pulmonary:     Effort: Pulmonary effort is normal.     Breath sounds: Normal breath sounds.  Chest:     Chest wall: No mass.  Breasts:    Tanner Score is 5.     Breasts are symmetrical.     Right: Normal. No mass, nipple discharge or skin change.     Left: Normal. No mass, nipple discharge or skin change.  Abdominal:     General:  Abdomen is flat.     Palpations: Abdomen is soft.     Tenderness: There is no abdominal tenderness.     Hernia: There is no hernia in the left inguinal area or right inguinal area.     Comments: Gravid   Genitourinary:    General: Normal vulva.     Exam position: Lithotomy position.     Pubic Area: No rash or pubic lice.      Tanner stage (genital): 5.     Labia:        Right: No rash.        Left: No rash.      Vagina: Normal. No foreign body. No vaginal discharge, erythema, tenderness or bleeding.     Cervix: Normal. No cervical motion tenderness, friability or erythema.     Uterus: Normal. Enlarged (Gravid, bimanual c/w ~12-15 weeks). Not tender.      Adnexa: Right adnexa normal and left adnexa normal.     Rectum: Normal. No external hemorrhoid.  Musculoskeletal:        General: Normal range of motion.     Cervical back: Neck supple. No rigidity or tenderness.     Right lower leg: No edema.     Left lower leg: No edema.  Lymphadenopathy:     Cervical: No cervical adenopathy.     Upper Body:     Right upper body: No axillary adenopathy.     Left upper body: No axillary adenopathy.     Lower Body: No right inguinal adenopathy. No left inguinal adenopathy.  Skin:    General: Skin is warm.     Capillary Refill: Capillary refill takes less than 2 seconds.  Neurological:     General: No focal deficit present.     Mental Status: She is alert and oriented to person, place, and time.  Psychiatric:        Mood and Affect: Mood normal.        Behavior: Behavior normal.    Assessment and Plan:  Pregnancy: [id number] at [redacted]w[redacted]d  [redacted] weeks gestation of pregnancy  Encounter for supervision of normal first pregnancy in first trimester Assessment & Plan: Doing well. BP normal at 124/64. Some nausea, will rx diclegis. TWG 16 lb (7.258 kg). Taking prenatal vitamin daily. Next prenatal appointment in 4 weeks . Patient does not meet criteria for aspirin use this pregnancy to prevent pre-E.    Anatomy scan ordered today, should be scheduled between 18 and [redacted] weeks gestation.   Discussed or disclosed in AVS: - Breast feeding support through [facility] breast feeding peer counselor program - Mood resources, including 988, Maternal Mental Health Line, and counselor Kathreen Cosier, LCSW, here at [facility] - Prenatal classes   Orders: -     Pregnancy, Initial Screen -     Lead, blood (adult age 42 yrs or greater) -     MaterniT 21 plus Core, Blood -     IGP, rfx  Aptima HPV ASCU -     WET PREP FOR TRICH, YEAST, CLUE -     Hemoglobin, fingerstick -     Doxylamine-Pyridoxine; Initial, 2 tablets (doxylamine succinate 10 mg/pyridoxine hydrochloride 10 mg per tablet) orally at bedtime on day 1 and 2; if symptoms persist, take 1 tablet in morning and 2 tablets at bedtime on day 3; if symptoms persist, may increase to MAX 4 tablets per day, administered as 1 tablet in the morning, 1 tablet in mid-afternoon and 2 tablets at bedtime  Dispense: 60 tablet; Refill: 2 -     US OB Comp + 14 Wk; Future  History of gestational hypertension Assessment & Plan: IOL at [redacted]w[redacted]d for gHTN on 05/02/2017. I have personally reviewed all the BP readings and labs from that admission and Ms. Loveall never met criteria for pre-eclampsia. Was never placed on antihypertensive medication for home use. Due to this and the lack of other risk factors in this pregnancy, Ms. Tafolla does not meet criteria for aspirin use to prevent pre-eclampsia during this pregnancy (2025).    Discussed overview of care and coordination with inpatient delivery practices including [facility],  [facility].   Preterm labor symptoms and general obstetric precautions including but not limited to vaginal bleeding, contractions, leaking of fluid and fetal movement were reviewed in detail with the patient.  Please refer to After Visit Summary for other counseling recommendations.   No follow-ups on file.  Future Appointments   Date Time Provider Department Center  04/26/2023  1:20 PM [facility] PROVIDER [facility] None   Clydene Fake, MD

## 2023-03-29 NOTE — Assessment & Plan Note (Signed)
 IOL at [redacted]w[redacted]d for gHTN on 05/02/2017. I have personally reviewed all the BP readings and labs from that admission and Natalie Mason never met criteria for pre-eclampsia. Was never placed on antihypertensive medication for home use. Due to this and the lack of other risk factors in this pregnancy, Natalie Mason does not meet criteria for aspirin use to prevent pre-eclampsia during this pregnancy (2025).

## 2023-03-29 NOTE — Patient Instructions (Signed)
 Pregnancy Continue taking your prenatal vitamin daily.  Please schedule your next prenatal visit for about 4 weeks  from now.   Prenatal Classes If delivering at [facility] with [facility] or [facility]: Go to [email or web address]   If delivering at [facility]: Go to [email or web address]://[email or web address].[email or web address]/ and search for: "Pregnancy and Parenting Classes" "Prepared Childbirth Classes"  Resource regarding exposures that could affect pregnancy: Mother To Pecola Leisure is a Dentist with lots of information on the effects of many medications and exposures on your pregnancy. It is free to use, including their web site, phone line, text service, app, or email and live chat.  http://golden-thomas.org/ Email or live chat: MotherToBaby.org Phone: (760)465-6942 Text: 612-210-4361 App: search "LactRx"   Maternal Mental Health If you start to develop the below symptoms of depression, please reach out to [place] for an appointment. There is also a Biomedical scientist Health Hotline at 228-500-1626 760-474-8896). This hotline has trained counselors, doulas, and midwifes to real-time support, information, and resources.  Feeling sad or hopeless most of the time Lack of interest in things you used to enjoy Less interest in caring for yourself (dressing, fixing hair) Trouble concentrating Trouble coping with daily tasks Constant worry about your baby Sleeping or eating too much or too little Feeling very anxious or nervous Unexplained irritability or anger Unwanted or scary thoughts Feeling that you are not a good mother Thoughts of hurting yourself or your baby  If you feel you are experiencing a mental health crisis, please reach out to the National Suicide Prevention Hotline at 1-800-273-TALK 3390231378).

## 2023-03-29 NOTE — Assessment & Plan Note (Addendum)
 Doing well. BP normal at 124/64. Some nausea, will rx diclegis. TWG 16 lb (7.258 kg). Taking prenatal vitamin daily. Next prenatal appointment in 4 weeks . Patient does not meet criteria for aspirin use this pregnancy to prevent pre-E.   Anatomy scan ordered today, should be scheduled between 18 and [redacted] weeks gestation.   Discussed or disclosed in AVS: - Breast feeding support through [facility] breast feeding peer counselor program - Mood resources, including 988, Maternal Mental Health Line, and counselor Kathreen Cosier, [place], here at [facility] - Prenatal classes

## 2023-03-30 ENCOUNTER — Telehealth: Payer: Self-pay

## 2023-03-30 NOTE — Telephone Encounter (Signed)
 Patient returned phone call to [facility]. Date, time and instructions given for upcoming U/S. BTHIELE RN

## 2023-03-30 NOTE — Telephone Encounter (Signed)
 Call to client to provide anatomy US appt scheduled for 05/04/23 at 1300. Client to arrive at [facility]. Left message to call with number provided. Jossie Ng, RN

## 2023-03-30 NOTE — Telephone Encounter (Signed)
 Records received late 03/29/23 pm from [facility] in response to faxed ROI earlier that same day. Records received were only wet prep test results (shredded) and not PAP reports as requested. Call to practice this am and explained regarding above. Per Danella Deis, PAP reports will be faxed to [facility] later this am. Jossie Ng, RN

## 2023-04-03 LAB — PREGNANCY, INITIAL SCREEN
Antibody Screen: NEGATIVE
Basophils Absolute: 0 10*3/uL (ref 0.0–0.2)
Basos: 0 %
Bilirubin, UA: NEGATIVE
Chlamydia trachomatis, NAA: NEGATIVE
EOS (ABSOLUTE): 0.2 10*3/uL (ref 0.0–0.4)
Eos: 3 %
HCV Ab: NONREACTIVE
HIV Screen 4th Generation wRfx: NONREACTIVE
Hematocrit: 34.8 % (ref 34.0–46.6)
Hemoglobin: 11.6 g/dL (ref 11.1–15.9)
Hepatitis B Surface Ag: NEGATIVE
Immature Grans (Abs): 0 10*3/uL (ref 0.0–0.1)
Immature Granulocytes: 0 %
Ketones, UA: NEGATIVE
Leukocytes,UA: NEGATIVE
Lymphocytes Absolute: 1.8 10*3/uL (ref 0.7–3.1)
Lymphs: 26 %
MCH: 30.3 pg (ref 26.6–33.0)
MCHC: 33.3 g/dL (ref 31.5–35.7)
MCV: 91 fL (ref 79–97)
Monocytes Absolute: 0.7 10*3/uL (ref 0.1–0.9)
Monocytes: 10 %
Neisseria Gonorrhoeae by PCR: NEGATIVE
Neutrophils Absolute: 4.3 10*3/uL (ref 1.4–7.0)
Neutrophils: 61 %
Nitrite, UA: NEGATIVE
Platelets: 264 10*3/uL (ref 150–450)
Protein,UA: NEGATIVE
RBC, UA: NEGATIVE
RBC: 3.83 x10E6/uL (ref 3.77–5.28)
RDW: 13 % (ref 11.7–15.4)
RPR Ser Ql: NONREACTIVE
Rh Factor: POSITIVE
Rubella Antibodies, IGG: 6.99 {index} (ref >0.99)
Specific Gravity, UA: 1.023 (ref 1.005–1.030)
Urobilinogen, Ur: 0.2 mg/dL (ref 0.2–1.0)
WBC: 6.9 10*3/uL (ref 3.4–10.8)
pH, UA: 6 (ref 5.0–7.5)

## 2023-04-03 LAB — MICROSCOPIC EXAMINATION
Casts: NONE SEEN /LPF
RBC, Urine: NONE SEEN /HPF (ref 0–2)

## 2023-04-03 LAB — MATERNIT 21 PLUS CORE, BLOOD
Fetal Fraction: 22
Result (T21): NEGATIVE
Trisomy 13 (Patau syndrome): NEGATIVE
Trisomy 18 (Edwards syndrome): NEGATIVE
Trisomy 21 (Down syndrome): NEGATIVE

## 2023-04-03 LAB — LEAD, BLOOD (ADULT >= 16 YRS): Lead-Whole Blood: <1 ug/dL (ref 0.0–3.4)

## 2023-04-03 LAB — URINE CULTURE, OB REFLEX

## 2023-04-03 LAB — HCV INTERPRETATION

## 2023-04-05 ENCOUNTER — Encounter: Payer: Self-pay | Admitting: Family Medicine

## 2023-04-05 ENCOUNTER — Other Ambulatory Visit: Payer: Self-pay | Admitting: Family Medicine

## 2023-04-05 DIAGNOSIS — O2340 Unspecified infection of urinary tract in pregnancy, unspecified trimester: Secondary | ICD-10-CM | POA: Insufficient documentation

## 2023-04-05 HISTORY — DX: Unspecified infection of urinary tract in pregnancy, unspecified trimester: O23.40

## 2023-04-05 MED ORDER — NITROFURANTOIN MONOHYD MACRO 100 MG PO CAPS: 100.0000 mg | ORAL_CAPSULE | Freq: Two times a day (BID) | ORAL | 0 refills | Status: DC

## 2023-04-08 LAB — IGP, RFX APTIMA HPV ASCU: PAP Smear Comment: 0

## 2023-04-08 LAB — HPV APTIMA: HPV Aptima: POSITIVE — AB

## 2023-04-09 ENCOUNTER — Encounter: Payer: Self-pay | Admitting: Family Medicine

## 2023-04-21 ENCOUNTER — Telehealth: Payer: Self-pay

## 2023-04-21 NOTE — Telephone Encounter (Signed)
 Received phone call from patient stating she has a cold and needs to know what medication to take. Reviewed with patient safe medication list during pregnancy located in blue packet given at IP appt. Patient denies fever but complains of cough and wants to know what to do about work. Advised patient to take medication for symptoms listed on safe medication list and to monitor temperature. 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 Tawny Hopping, RN

## 2023-04-26 ENCOUNTER — Encounter: Payer: Self-pay | Admitting: Family Medicine

## 2023-04-26 ENCOUNTER — Ambulatory Visit: Payer: Medicaid Other | Admitting: Family Medicine

## 2023-04-26 VITALS — BP 105/61 | HR 91 | Temp 91.0°F | Wt 115.2 lb

## 2023-04-26 DIAGNOSIS — R87610 Atypical squamous cells of undetermined significance on cytologic smear of cervix (ASC-US): Secondary | ICD-10-CM

## 2023-04-26 DIAGNOSIS — O2340 Unspecified infection of urinary tract in pregnancy, unspecified trimester: Secondary | ICD-10-CM | POA: Diagnosis not present

## 2023-04-26 DIAGNOSIS — O2342 Unspecified infection of urinary tract in pregnancy, second trimester: Secondary | ICD-10-CM

## 2023-04-26 DIAGNOSIS — Z3A18 18 weeks gestation of pregnancy: Secondary | ICD-10-CM

## 2023-04-26 DIAGNOSIS — Z3402 Encounter for supervision of normal first pregnancy, second trimester: Secondary | ICD-10-CM

## 2023-04-26 DIAGNOSIS — R87810 Cervical high risk human papillomavirus (HPV) DNA test positive: Secondary | ICD-10-CM

## 2023-04-26 DIAGNOSIS — Z3401 Encounter for supervision of normal first pregnancy, first trimester: Secondary | ICD-10-CM

## 2023-04-26 NOTE — Assessment & Plan Note (Signed)
 Completed macrobid course without issue. Asymptomatic. Will recollect urine culture today for TOC.

## 2023-04-26 NOTE — Progress Notes (Signed)
  [facility] [address], [address] [place] [address] Main phone: [phone]  Prenatal Visit  Subjective:  Natalie Mason is a 23 y.o. [id number] at [redacted]w[redacted]d being seen today for ongoing prenatal care.  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  Contractions: Not present. Vag. Bleeding: None.  Movement: Present (Flutters). Denies leaking of fluid/ROM.   The following portions of the patient's history were reviewed and updated as appropriate: allergies, current medications, past family history, past medical history, past social history, past surgical history and problem list. Problem list updated.  Objective:   Vitals:   04/26/23 1331  BP: 105/61  Pulse: 91  Temp: (!) 91 F (32.8 C)  Weight: 115 lb 3.2 oz (52.3 kg)   Fetal Status: Fetal Heart Rate (bpm): 156 Fundal Height: 19 cm Movement: Present (Flutters)     General:  Alert, oriented and cooperative. Patient is in no acute distress.  Skin: Skin is warm and dry. No rash noted.   Cardiovascular: Normal heart rate noted  Respiratory: Normal respiratory effort, no problems with respiration noted  Abdomen: Soft, gravid, appropriate for gestational age.  Pain/Pressure: Absent     Pelvic: Cervical exam deferred        Extremities: Normal range of motion.     Mental Status: Normal mood and affect. Normal behavior. Normal judgment and thought content.   Assessment and Plan:  Pregnancy: [id number] at [redacted]w[redacted]d  [redacted] weeks gestation of pregnancy  Encounter for supervision of normal first pregnancy  in first trimester Assessment & Plan: Doing well. BP normal at 105/61. TWG 16 lb 3.2 oz (7.348 kg), although weight stable from initial OB appointment. Wonder if pre-gravid weight recorded is accurate. Taking prenatal vitamin daily. Next prenatal appointment in 4 weeks .   Discussed or disclosed in AVS: - Breast feeding support through [facility] breast feeding peer counselor program - Mood resources, including 988, Maternal Mental Health Line   Urinary tract infection affecting pregnancy, antepartum- E.Coli Assessment & Plan: Completed macrobid course without issue. Asymptomatic. Will recollect urine culture today for TOC.   Orders: -     Urine Culture  ASCUS with positive high risk HPV cervical Assessment & Plan: Plan for repeat PAP in one year.    Preterm labor symptoms and general obstetric precautions including but not limited to vaginal bleeding, contractions, leaking of fluid and fetal movement were reviewed in detail with the patient. Please refer to After Visit Summary for other counseling recommendations.  No follow-ups on file.  Future Appointments  Date Time Provider Department Center  05/04/2023  1:00 PM [facility]   Clydene Fake, MD

## 2023-04-26 NOTE — Progress Notes (Signed)
 Aware of 05/04/23 1300 OB US appt at [facility]. Counseled to arrive at 1230 with a full bladder 32 oz water) and not void. Understanding verbalized. Per client, completed UTI treatment and TOC toda. Desires MaterniT-21 but does not want to know fetal sex. MaterniT-21 result given to client in a sealed envelope. Concerned lab will not be able to obtain blood for AFP testing today due to limited po intake today. Desires at next [facility] RV and will "drink extra" in preparation for blood draw. Jossie Ng, RN

## 2023-04-26 NOTE — Patient Instructions (Signed)
 Pregnancy Continue taking your prenatal vitamin daily.  Please schedule your next prenatal visit for about 4 weeks  from now.  Try ginger (fresh, candied, lozenges, etc) for your nausea.  Keep doing small frequent meals.  Please visit WIC (women's, infants, and children program) to see about their breast feeding peer support program. You can start this program to get tips and tricks for successful breast feeding of your baby.   Prenatal Classes If delivering at [facility] with [facility] or [facility]: Go to [email or web address]   If delivering at [facility]: Go to [email or web address]://www.[facility].[email or web address]/ and search for: "Pregnancy and Parenting Classes" "Prepared Childbirth Classes"  Resource regarding exposures that could affect pregnancy: Mother To Pecola Leisure is a Dentist with lots of information on the effects of many medications and exposures on your pregnancy. It is free to use, including their web site, phone line, text service, app, or email and live chat.  http://golden-thomas.org/ Email or live chat: MotherToBaby.org Phone: (425)[phone] Text: [phone] App: search "LactRx"   Maternal Mental Health If you start to develop the below symptoms of depression, please reach out to [place] for an appointment. There is also a Biomedical scientist Health Hotline at [phone] [phone]). This hotline has trained counselors, doulas, and midwifes to real-time support, information, and resources.  Feeling sad or hopeless most of the time Lack of interest in things you used to enjoy Less interest in caring for yourself (dressing, fixing hair) Trouble concentrating Trouble coping with daily tasks Constant worry about your baby Sleeping or eating too much or too little Feeling very anxious or nervous Unexplained irritability or anger Unwanted or scary thoughts Feeling that you are not a good mother Thoughts  of hurting yourself or your baby  If you feel you are experiencing a mental health crisis, please reach out to the National Suicide Prevention Hotline at 1-800-273-TALK 786-033-7477).

## 2023-04-26 NOTE — Assessment & Plan Note (Signed)
 Plan for repeat PAP in one year.

## 2023-04-26 NOTE — Assessment & Plan Note (Signed)
 Doing well. BP normal at 105/61. TWG 16 lb 3.2 oz (7.348 kg), although weight stable from initial OB appointment. Wonder if pre-gravid weight recorded is accurate. Taking prenatal vitamin daily. Next prenatal appointment in 4 weeks .   Discussed or disclosed in AVS: - Breast feeding support through [facility] breast feeding peer counselor program - Mood resources, including 988, Maternal Mental Health Line

## 2023-04-28 LAB — URINE CULTURE

## 2023-04-30 ENCOUNTER — Telehealth: Payer: Self-pay | Admitting: Family Medicine

## 2023-04-30 ENCOUNTER — Encounter: Payer: Self-pay | Admitting: Family Medicine

## 2023-04-30 DIAGNOSIS — O2340 Unspecified infection of urinary tract in pregnancy, unspecified trimester: Secondary | ICD-10-CM

## 2023-04-30 MED ORDER — FOSFOMYCIN TROMETHAMINE 3 G PO PACK: 3.0000 g | PACK | Freq: Once | ORAL | 0 refills | Status: AC

## 2023-04-30 NOTE — Telephone Encounter (Signed)
 Urine TOC shows persistent E. Coli bacteriuria despite reported patient adherence to macrobid. Fosfomycin prescribed and sent to preferred pharmacy.   Plan to recheck urine for TOC no less than one week after treatment.   Fayette Pho, MD 04/30/23  5:20 PM

## 2023-05-03 NOTE — Telephone Encounter (Signed)
 Patient notified that another antibiotic prescribed and sent to preferred pharmacy. Patient states she received voice message and plans to pick up antibiotic Tuesday or Wednesday. Scheduled Urine TOC for 05/13/2023. Delynn Flavin RN

## 2023-05-04 ENCOUNTER — Ambulatory Visit
Admission: RE | Admit: 2023-05-04 | Discharge: 2023-05-04 | Disposition: A | Discharge status: Home / Self Care | Payer: Medicaid Other | Source: Ambulatory Visit | Attending: Family Medicine | Admitting: Family Medicine

## 2023-05-04 ENCOUNTER — Other Ambulatory Visit: Payer: Self-pay | Admitting: Family Medicine

## 2023-05-04 DIAGNOSIS — Z3A13 13 weeks gestation of pregnancy: Secondary | ICD-10-CM | POA: Insufficient documentation

## 2023-05-04 DIAGNOSIS — Z3689 Encounter for other specified antenatal screening: Secondary | ICD-10-CM | POA: Diagnosis not present

## 2023-05-04 DIAGNOSIS — Z3A14 14 weeks gestation of pregnancy: Secondary | ICD-10-CM

## 2023-05-04 DIAGNOSIS — Z3401 Encounter for supervision of normal first pregnancy, first trimester: Secondary | ICD-10-CM | POA: Insufficient documentation

## 2023-05-04 DIAGNOSIS — Z8759 Personal history of other complications of pregnancy, childbirth and the puerperium: Secondary | ICD-10-CM

## 2023-05-07 ENCOUNTER — Telehealth: Payer: Self-pay | Admitting: Family Medicine

## 2023-05-07 ENCOUNTER — Encounter: Payer: Self-pay | Admitting: Family Medicine

## 2023-05-07 ENCOUNTER — Other Ambulatory Visit: Payer: Self-pay | Admitting: Family Medicine

## 2023-05-07 DIAGNOSIS — Z3A14 14 weeks gestation of pregnancy: Secondary | ICD-10-CM

## 2023-05-07 DIAGNOSIS — Z3401 Encounter for supervision of normal first pregnancy, first trimester: Secondary | ICD-10-CM

## 2023-05-07 NOTE — Telephone Encounter (Signed)
 Called Natalie Mason. Discussed 4/01 US showing earlier pregnancy than previously thought.   Natalie Mason also asks that we cancel her 4/10 appointment to drop off a urine sample. She reports she has not been able to pick up the medicine yet. I will cancel this appointment now. Discussed calling us when she does pick up the medicine so we can schedule a urine drop off for 7 days after COMPLETION of her antibiotic regimen. She verbalizes understanding.   Fayette Pho, MD 05/07/23  2:18 PM

## 2023-05-07 NOTE — Progress Notes (Signed)
 Reviewed anatomy US. Was scheduled for 4/01 based on optimal GA for anatomy scans based on sure LMP. US showed pregnancy much earlier than expected, was [redacted]w[redacted]d on day of scan.  New EDD = 11/03/23  As of today, most accurate GA is [redacted]w[redacted]d  - Will update OB tab, including updating the due date.  - Will also re-schedule anatomy scan for proper timing (between 4/30 and 5/20)  Fayette Pho, MD 05/07/23  2:07 PM

## 2023-05-07 NOTE — Progress Notes (Signed)
 Reviewed anatomy US. Was scheduled for 4/01 based on optimal GA for anatomy scans based on sure LMP. US showed pregnancy much earlier than expected, was [redacted]w[redacted]d on day of scan. Will update OB tab, including updating the due date. Will also re-schedule anatomy scan for proper timing.   Fayette Pho, MD 05/07/23  2:06 PM

## 2023-05-07 NOTE — Addendum Note (Signed)
 Addended by: Valetta Close on: 05/07/2023 02:11 PM   Modules accepted: Orders

## 2023-05-10 NOTE — Addendum Note (Signed)
 Addended by: Heywood Bene on: 05/10/2023 11:31 AM   Modules accepted: Orders

## 2023-05-11 ENCOUNTER — Telehealth: Payer: Self-pay

## 2023-05-11 NOTE — Telephone Encounter (Signed)
 Patient called and given date, time location and instructions for U/S scheduled on 06/02/2023 @ 11 a.m. (arrival time 10:45 a.m.). Patient verbalized understanding. Delynn Flavin RN

## 2023-05-13 ENCOUNTER — Other Ambulatory Visit

## 2023-05-24 ENCOUNTER — Ambulatory Visit: Admitting: Nurse Practitioner

## 2023-05-24 VITALS — BP 92/41 | HR 82 | Temp 97.6°F | Wt 119.6 lb

## 2023-05-24 DIAGNOSIS — Z3A16 16 weeks gestation of pregnancy: Secondary | ICD-10-CM

## 2023-05-24 DIAGNOSIS — O2340 Unspecified infection of urinary tract in pregnancy, unspecified trimester: Secondary | ICD-10-CM

## 2023-05-24 DIAGNOSIS — O2342 Unspecified infection of urinary tract in pregnancy, second trimester: Secondary | ICD-10-CM

## 2023-05-24 DIAGNOSIS — Z3482 Encounter for supervision of other normal pregnancy, second trimester: Secondary | ICD-10-CM

## 2023-05-24 DIAGNOSIS — Z3480 Encounter for supervision of other normal pregnancy, unspecified trimester: Secondary | ICD-10-CM

## 2023-05-24 MED ORDER — FOSFOMYCIN TROMETHAMINE 3 G PO PACK: 3.0000 g | PACK | Freq: Once | ORAL | 0 refills | Status: AC

## 2023-05-24 NOTE — Progress Notes (Signed)
 [facility] [address] [place] [address] Main phone: [phone]  Prenatal Visit  Subjective:  Natalie Mason is a 23 y.o. G6Y4034 at [redacted]w[redacted]d being seen today for ongoing prenatal care.  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  Contractions: Not present. Vag. Bleeding: None.  Movement: Present. Denies leaking of fluid/ROM.   The following portions of the patient's history were reviewed and updated as appropriate: allergies, current medications, past family history, past medical history, past social history, past surgical history and problem list. Problem list updated.  Objective:   Vitals:   05/24/23 1317  BP: (!) 92/41  Pulse: 82  Temp: 97.6 F (36.4 C)  Weight: 119 lb 9.6 oz (54.3 kg)    Fetal Status: Fetal Heart Rate (bpm): 154 Fundal Height:  (1 fingerwidth below umbilicus) Movement: Present     General:  Alert, oriented and cooperative. Patient is in no acute distress.  Skin: Skin is warm and dry. No rash noted.   Cardiovascular: Normal heart rate noted  Respiratory: Normal respiratory effort, no problems with respiration noted  Abdomen: Soft, gravid, appropriate for gestational age.  Pain/Pressure: Absent     Pelvic: Cervical exam deferred        Extremities: Normal range of motion.  Edema: None  Mental Status: Normal mood and affect. Normal behavior. Normal judgment and thought content.   Assessment and Plan:  Pregnancy: [id number] at [redacted]w[redacted]d  1. [redacted] weeks gestation of pregnancy (Primary) RV in 4 weeks about 20w GA. Appt for  anatomy US  scheduled for 06/02/23 at 11am. Patient aware.   2. Supervision of other normal pregnancy, antepartum Patient doing well today.  BP normal @ 92/41. Discussed with Earleen Glazier, FNP-C and this is an expected finding in some pregnancies.  Patient reports not taking PNV regularly. She states last dose about 2 to 3 weeks ago. She reports having to crush the pill and take it with applesauce or yogurt, but does not have anything to take the pill with at this time. She reports not liking the gummies because the folic acid content makes her nauseous. Explained to patient the importance of PNV on baby's growth and development and overall nutritional needs as well as the benefit to her hgb. Educated on risks of decreased hgb. Patient stated no one had ever explained that to her before.  Earleen Glazier, FNP-C spoke to patient about trying Vitamin B6 or doxylamine  (Diclegis  prescribed by Dr. Tempie Fee, M.D.) to help with the nausea when taking PNV. Patient verbalized understanding and agreed with plan.   Total Weight Gain: 20 lb 9.6 oz (9.344 kg) 4lb gain/loss in last month. Pre-gravid BMII= 16.98(Underweight). Expected weight gain this pregnancy 28-40 pounds. Patient is within expected weight range at this time.   3. Urinary tract infection affecting pregnancy, antepartum- E.Coli -TOC remained positive for E.Coli on 04/26/23 and patient was Rx'd Fosfomycin- has not picked up as of today -resent Rx to pharmacy- counseled on importance of picking up and taking medication -will do TOC at RV  - fosfomycin (MONUROL ) 3 g PACK; Take 3 g by mouth once for 1 dose.  Dispense: 3 g; Refill: 0  Preterm labor symptoms and general obstetric precautions including but not limited to vaginal bleeding, contractions, leaking of fluid and fetal movement were reviewed in detail with the patient. Please refer to After Visit Summary for other counseling recommendations.  Return in about 4 weeks (around  06/21/2023) for 20 week prenatal care.  Future Appointments  Date Time Provider Department Center  06/02/2023 11:00 AM [facility]  [facility]  [facility]    Merleen Stare, NP

## 2023-05-24 NOTE — Progress Notes (Signed)

## 2023-05-24 NOTE — Progress Notes (Signed)
 Here for MH RV at 16 5/7. Desires AFP at next appointment. States she was unable to pick up her medication for UTI. Aware of anatomy U/S on 06/02/2023.Rosiland Cooks, RN

## 2023-06-02 ENCOUNTER — Ambulatory Visit
Admission: RE | Admit: 2023-06-02 | Discharge: 2023-06-02 | Disposition: A | Discharge status: Home / Self Care | Source: Ambulatory Visit | Attending: Family Medicine | Admitting: Family Medicine

## 2023-06-02 DIAGNOSIS — Z3401 Encounter for supervision of normal first pregnancy, first trimester: Secondary | ICD-10-CM | POA: Insufficient documentation

## 2023-06-02 DIAGNOSIS — Z3A18 18 weeks gestation of pregnancy: Secondary | ICD-10-CM | POA: Diagnosis not present

## 2023-06-02 DIAGNOSIS — Z369 Encounter for antenatal screening, unspecified: Secondary | ICD-10-CM | POA: Diagnosis not present

## 2023-06-18 ENCOUNTER — Ambulatory Visit: Payer: Self-pay | Admitting: Family Medicine

## 2023-06-18 NOTE — Progress Notes (Signed)
 Reviewed anatomy US . GA estimated [redacted]w[redacted]d. Normal anatomy, 3 vessel cord, and anterior placenta. To be discussed at next pnv.   Tempie Fee, MD 06/18/23  9:55 AM

## 2023-06-21 ENCOUNTER — Encounter: Payer: Self-pay | Admitting: Family Medicine

## 2023-06-21 ENCOUNTER — Ambulatory Visit: Admitting: Family Medicine

## 2023-06-21 VITALS — BP 119/64 | HR 97 | Temp 97.1°F | Wt 125.0 lb

## 2023-06-21 DIAGNOSIS — O2342 Unspecified infection of urinary tract in pregnancy, second trimester: Secondary | ICD-10-CM

## 2023-06-21 DIAGNOSIS — Z3A20 20 weeks gestation of pregnancy: Secondary | ICD-10-CM

## 2023-06-21 DIAGNOSIS — O2340 Unspecified infection of urinary tract in pregnancy, unspecified trimester: Secondary | ICD-10-CM

## 2023-06-21 DIAGNOSIS — Z3480 Encounter for supervision of other normal pregnancy, unspecified trimester: Secondary | ICD-10-CM

## 2023-06-21 DIAGNOSIS — Z3482 Encounter for supervision of other normal pregnancy, second trimester: Secondary | ICD-10-CM

## 2023-06-21 DIAGNOSIS — Z8759 Personal history of other complications of pregnancy, childbirth and the puerperium: Secondary | ICD-10-CM

## 2023-06-21 NOTE — Progress Notes (Signed)
  [facility] [address], [address] [place] [address] Main phone: [phone]  Prenatal Visit  Subjective:  Natalie Mason is a 23 y.o. G3O7564 at [redacted]w[redacted]d being seen today for ongoing prenatal care.  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  Contractions: Not present. Vag. Bleeding: None.  Movement: Present. Denies leaking of fluid/ROM.   The following portions of the patient's history were reviewed and updated as appropriate: allergies, current medications, past family history, past medical history, past social history, past surgical history and problem list. Problem list updated.  Objective:   Vitals:   06/21/23 1539  BP: 119/64  Pulse: 97  Temp: (!) 97.1 F (36.2 C)  Weight: 125 lb (56.7 kg)    Fetal Status: Fetal Heart Rate (bpm): 154 Fundal Height: 21 cm Movement: Present     General:  Alert, oriented and cooperative. Patient is in no acute distress.  Skin: Skin is warm and dry. No rash noted.   Cardiovascular: Normal heart rate noted  Respiratory: Normal respiratory effort, no problems with respiration noted  Abdomen: Soft, gravid, appropriate for gestational age.  Pain/Pressure: Absent     Pelvic: Cervical exam deferred        Extremities: Normal range of motion.  Edema: None  Mental Status: Normal mood and affect. Normal behavior. Normal judgment and thought content.   Assessment and Plan:  Pregnancy: [id number] at [redacted]w[redacted]d  1. [redacted] weeks gestation of pregnancy (Primary) -reviewed US  from 4/30- WNL AFP offered today- declined  2.  Supervision of other normal pregnancy, antepartum -taking PNV daily   3. History of gestational hypertension -BP wnl today   4. Urinary tract infection affecting pregnancy, antepartum- E.Coli -TOC today   Preterm labor symptoms and general obstetric precautions including but not limited to vaginal bleeding, contractions, leaking of fluid and fetal movement were reviewed in detail with the patient. Please refer to After Visit Summary for other counseling recommendations.  Return in about 4 weeks (around 07/19/2023) for Routine Prenatal Care.  No future appointments.   Earleen Glazier, [place]

## 2023-06-21 NOTE — Progress Notes (Signed)
 Declined AFP screen.Gery Kubas RN

## 2023-06-23 LAB — URINE CULTURE

## 2023-06-24 ENCOUNTER — Telehealth: Payer: Self-pay | Admitting: Family Medicine

## 2023-06-24 ENCOUNTER — Ambulatory Visit: Payer: Self-pay | Admitting: Family Medicine

## 2023-06-24 DIAGNOSIS — O2340 Unspecified infection of urinary tract in pregnancy, unspecified trimester: Secondary | ICD-10-CM

## 2023-06-24 MED ORDER — SULFAMETHOXAZOLE-TRIMETHOPRIM 800-160 MG PO TABS: 1.0000 | ORAL_TABLET | Freq: Two times a day (BID) | ORAL | 0 refills | Status: AC

## 2023-06-24 NOTE — Telephone Encounter (Signed)
 Called Patient to discuss urine results from culture that were positive for E.coli.   Reviewed that I would be sending in medication to pharmacy and counseled to pick up tonight and start taking.  No questions.   1. Urinary tract infection affecting pregnancy, antepartum- E.Coli (Primary) -reviewed hx of UTI this pregnancy- already treated with macrobid  and fosfomycin -has PCN allergy listed -per UpToDate, reasonable to try Bactrim if not in first trimester or at term    - sulfamethoxazole-trimethoprim (BACTRIM DS) 800-160 MG tablet; Take 1 tablet by mouth 2 (two) times daily for 3 days.  Dispense: 6 tablet; Refill: 0   [facility] FNP-C

## 2023-07-19 ENCOUNTER — Encounter: Payer: Self-pay | Admitting: Family Medicine

## 2023-07-19 ENCOUNTER — Ambulatory Visit: Admitting: Family Medicine

## 2023-07-19 VITALS — BP 102/60 | HR 107 | Temp 97.2°F | Wt 127.8 lb

## 2023-07-19 DIAGNOSIS — O2340 Unspecified infection of urinary tract in pregnancy, unspecified trimester: Secondary | ICD-10-CM

## 2023-07-19 DIAGNOSIS — Z3480 Encounter for supervision of other normal pregnancy, unspecified trimester: Secondary | ICD-10-CM | POA: Diagnosis not present

## 2023-07-19 DIAGNOSIS — O2342 Unspecified infection of urinary tract in pregnancy, second trimester: Secondary | ICD-10-CM

## 2023-07-19 DIAGNOSIS — Z3482 Encounter for supervision of other normal pregnancy, second trimester: Secondary | ICD-10-CM

## 2023-07-19 DIAGNOSIS — Z3A24 24 weeks gestation of pregnancy: Secondary | ICD-10-CM

## 2023-07-19 NOTE — Progress Notes (Signed)
  [facility] [address] [place] [address] Main phone: [phone]  Prenatal Visit  Subjective:  Natalie Mason is a 23 y.o. G4W1027 at [redacted]w[redacted]d being seen today for ongoing prenatal care.  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  Contractions: Not present. Vag. Bleeding: None.  Movement: Present. Denies leaking of fluid/ROM.   The following portions of the patient's history were reviewed and updated as appropriate: allergies, current medications, past family history, past medical history, past social history, past surgical history and problem list. Problem list updated.  Objective:   Vitals:   07/19/23 1403  BP: 102/60  Pulse: (!) 107  Temp: (!) 97.2 F (36.2 C)  Weight: 127 lb 12.8 oz (58 kg)    Fetal Status: Fetal Heart Rate (bpm): 150 Fundal Height: 24 cm Movement: Present     General:  Alert, oriented and cooperative. Patient is in no acute distress.  Skin: Skin is warm and dry. No rash noted.   Cardiovascular: Normal heart rate noted  Respiratory: Normal respiratory effort, no problems with respiration noted  Abdomen: Soft, gravid, appropriate for gestational age.  Pain/Pressure: Absent     Pelvic: Cervical exam deferred        Extremities: Normal range of motion.  Edema: None  Mental Status: Normal mood and affect. Normal behavior. Normal judgment and thought content.   Assessment and Plan:  Pregnancy: [id number] at [redacted]w[redacted]d  1. [redacted] weeks  gestation of pregnancy (Primary) -pre E counseling at RV  2. Supervision of other normal pregnancy, antepartum -taking PNV daily 28 lb 12.8 oz (13.1 kg) -reports 2 of her children have hand foot and mouth- they got it from another kid in the neighborhood   Preterm labor symptoms and general obstetric precautions including but not limited to vaginal bleeding, contractions, leaking of fluid and fetal movement were reviewed in detail with the patient. Please refer to After Visit Summary for other counseling recommendations.  Return in about 4 weeks (around 08/16/2023) for Routine Prenatal Care.  No future appointments.   Earleen Glazier, [place]

## 2023-07-22 LAB — URINE CULTURE

## 2023-07-23 ENCOUNTER — Ambulatory Visit: Payer: Self-pay | Admitting: Family Medicine

## 2023-07-23 DIAGNOSIS — O2340 Unspecified infection of urinary tract in pregnancy, unspecified trimester: Secondary | ICD-10-CM

## 2023-07-23 NOTE — Progress Notes (Signed)
 Urine culture unchanged from previous despite patient reports of taking medication as prescribed. Will need to call patient to discuss how she is taking medication. If suspect true treatment failure (although less likely given persistent sensitivities), would recommend keflex 500 mg Q6 hr for 7 days.   Tempie Fee, MD 07/23/23  5:58 PM

## 2023-07-26 ENCOUNTER — Telehealth: Payer: Self-pay | Admitting: Family Medicine

## 2023-07-26 MED ORDER — CEPHALEXIN 500 MG PO CAPS: 500.0000 mg | ORAL_CAPSULE | Freq: Four times a day (QID) | ORAL | 0 refills | Status: AC

## 2023-07-26 NOTE — Telephone Encounter (Signed)
 Telephone call placed to patient regarding positive urine culture for E.coli and need to treat.  Reports hx of PCN allergy with rash- however this was in 2013, and patient is not sure when rash appeared  Ok to proceed with Keflex per Dr. Macario. Given precautions for when to go to ER.   [facility] FNP-C

## 2023-07-26 NOTE — Addendum Note (Signed)
 Addended by: VEDA HUMMINGBIRD on: 07/26/2023 12:03 PM   Modules accepted: Orders

## 2023-08-16 ENCOUNTER — Ambulatory Visit: Admitting: Family Medicine

## 2023-08-16 VITALS — BP 116/69 | HR 105 | Temp 97.9°F | Wt 133.4 lb

## 2023-08-16 DIAGNOSIS — Z3A28 28 weeks gestation of pregnancy: Secondary | ICD-10-CM

## 2023-08-16 DIAGNOSIS — Z23 Encounter for immunization: Secondary | ICD-10-CM

## 2023-08-16 DIAGNOSIS — O2343 Unspecified infection of urinary tract in pregnancy, third trimester: Secondary | ICD-10-CM

## 2023-08-16 DIAGNOSIS — O2340 Unspecified infection of urinary tract in pregnancy, unspecified trimester: Secondary | ICD-10-CM

## 2023-08-16 DIAGNOSIS — Z3483 Encounter for supervision of other normal pregnancy, third trimester: Secondary | ICD-10-CM

## 2023-08-16 DIAGNOSIS — Z8759 Personal history of other complications of pregnancy, childbirth and the puerperium: Secondary | ICD-10-CM

## 2023-08-16 DIAGNOSIS — Z3480 Encounter for supervision of other normal pregnancy, unspecified trimester: Secondary | ICD-10-CM

## 2023-08-16 NOTE — Patient Instructions (Signed)
 Pregnancy Continue taking your prenatal vitamin daily.  Please schedule your next prenatal visit for about 2 weeks from now.  Please visit WIC (women's, infants, and children program) to see about their breast feeding peer support program. You can start this program to get tips and tricks for successful breast feeding of your baby.   Safe Medications in Pregnancy   Constipation: Colace Ducolax suppositories Fleet enema Glycerin  suppositories Metamucil Milk of magnesia Miralax  Senokot Smooth move tea  Diarrhea: Kaopectate Imodium A-D  *NO pepto Bismol  Hemorrhoids: Anusol Anusol HC Preparation H Tucks  Indigestion: Tums Maalox Mylanta Cimetidine (Tagamet HB)** preferred in pregnancy Famotidine (Pepcid) Ranitidine (Zantac)  Insomnia: Benadryl  (alcohol free) 25mg  every 6 hours as needed Tylenol  PM Unisom , no Gelcaps  Leg Cramps: Tums MagGel  Nausea/Vomiting:  Bonine Dramamine Emetrol Ginger extract Sea bands Meclizine   Nausea medication to take during pregnancy:  Unisom  (doxylamine  succinate 25 mg tablets) Take one tablet daily at bedtime. If symptoms are not adequately controlled, the dose can be increased to a maximum recommended dose of two tablets daily (1/2 tablet in the morning, 1/2 tablet mid-afternoon and one at bedtime). Vitamin B6 100mg  tablets. Take one tablet twice a day (up to 200 mg per day).  **If taking multiple medications, please check labels to avoid duplicating the same active ingredients **take medication as directed on the label ** Do not exceed 4000 mg of tylenol  in 24 hours **Do not take medications that contain aspirin  or ibuprofen   Prenatal Classes If delivering at [facility] with [facility] or [facility]: Go to [email or web address]   If delivering at [facility]: Go to [email or web address] and search for: Pregnancy and Parenting Classes Prepared Childbirth  Classes  Resource regarding exposures that could affect pregnancy: Mother To [employer] is a Dentist with lots of information on the effects of many medications and exposures on your pregnancy. It is free to use, including their web site, phone line, text service, app, or email and live chat.  [email or web address]-[email or web address]/ Email or live chat: MotherToBaby.org Phone: [phone] Text: 6033707180 App: search LactRx   Maternal Mental Health If you start to develop the below symptoms of depression, please reach out to us  for an appointment. There is also a Biomedical scientist Health Hotline at [phone]). This hotline has trained counselors, doulas, and midwifes to real-time support, information, and resources.  Feeling sad or hopeless most of the time Lack of interest in things you used to enjoy Less interest in caring for yourself (dressing, fixing hair) Trouble concentrating Trouble coping with daily tasks Constant worry about your baby Sleeping or eating too much or too little Feeling very anxious or nervous Unexplained irritability or anger Unwanted or scary thoughts Feeling that you are not a good mother Thoughts of hurting yourself or your baby  If you feel you are experiencing a mental health crisis, please reach out to the National Suicide Prevention Hotline at 1-800-273-TALK 919-099-1463).

## 2023-08-17 NOTE — Progress Notes (Signed)
  [facility] [address] [place] [address] Main phone: [phone]  Prenatal Visit  Subjective:  Natalie Mason is a 23 y.o. H2E6966 at [redacted]w[redacted]d being seen today for ongoing prenatal care.  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  Contractions: Not present. Vag. Bleeding: None.  Movement: Present. Denies leaking of fluid/ROM.   The following portions of the patient's history were reviewed and updated as appropriate: allergies, current medications, past family history, past medical history, past social history, past surgical history and problem list. Problem list updated.  Objective:   Vitals:   08/17/23 2217  BP: 116/69  Pulse: (!) 105  Temp: 97.9 F (36.6 C)  Weight: 133 lb 6.4 oz (60.5 kg)   Fetal Status: Fetal Heart Rate (bpm): 146 Fundal Height: 27 cm Movement: Present     General:  Alert, oriented and cooperative. Patient is in no acute distress.  Skin: Skin is warm and dry. No rash noted.   Cardiovascular: Normal heart rate noted  Respiratory: Normal respiratory effort, no problems with respiration noted  Abdomen: Soft, gravid, appropriate for gestational age.  Pain/Pressure: Absent     Pelvic: Cervical exam deferred        Extremities: Normal range of motion.     Mental Status: Normal mood and affect. Normal behavior. Normal judgment and thought content.   Assessment and Plan:  Pregnancy: H2E6966 at [redacted]w[redacted]d  [redacted] weeks gestation of pregnancy  Supervision of other normal pregnancy, antepartum Assessment & Plan: Doing well. BP  normal. TWG 34 lb 6.4 oz (15.6 kg). Taking prenatal vitamin daily. Next prenatal appointment in 2 weeks.  - collected routine 28 week labs  Discussed or disclosed in AVS: - Breast feeding support through [facility] breast feeding peer counselor program - Mood resources, including 988, Maternal Mental Health Line, and counselor Alan Hail, LCSW, here at [facility] - Prenatal classes - Pain control during delivery: IV pain medication - Support person during delivery: FOB Josh - Pre-eclampsia warning signs: Signs and symptoms of preeclampsia were verbally reviewed with warning signs, when and how to call. A written handout with tips on how to take your blood pressure, as well as warning signs was provided to the patient.    Orders: -     Tdap vaccine greater than or equal to 7yo IM -     HIV-1/HIV-2 Qualitative RNA -     RPR -     Hemoglobin, fingerstick  Urinary tract infection affecting pregnancy, antepartum- E.Coli Assessment & Plan: TOC collected today.   Orders: -     Urine Culture  History of gestational hypertension   Preterm labor symptoms and general obstetric precautions including but not limited to vaginal bleeding, contractions, leaking of fluid and fetal movement were reviewed in detail with the patient. Please refer to After Visit Summary for other counseling recommendations.  No follow-ups on file.  Future Appointments  Date Time Provider Department Center  08/24/2023 10:20 AM [facility] PROVIDER [facility] None  08/31/2023 10:40 AM [facility] PROVIDER [facility] None   Betsey CHRISTELLA Helling, MD

## 2023-08-17 NOTE — Assessment & Plan Note (Signed)
-   TOC collected today

## 2023-08-17 NOTE — Assessment & Plan Note (Addendum)
 Doing well. BP normal. TWG 34 lb 6.4 oz (15.6 kg). Taking prenatal vitamin daily. Next prenatal appointment in 2 weeks.  - collected routine 28 week labs  Discussed or disclosed in AVS: - Breast feeding support through [facility] breast feeding peer counselor program - Mood resources, including 988, Maternal Mental Health Line, and counselor Alan Hail, LCSW, here at [facility] - Prenatal classes - Pain control during delivery: IV pain medication - Support person during delivery: FOB Josh - Pre-eclampsia warning signs: Signs and symptoms of preeclampsia were verbally reviewed with warning signs, when and how to call. A written handout with tips on how to take your blood pressure, as well as warning signs was provided to the patient.

## 2023-08-18 ENCOUNTER — Ambulatory Visit: Payer: Self-pay | Admitting: Family Medicine

## 2023-08-18 ENCOUNTER — Encounter: Payer: Self-pay | Admitting: Family Medicine

## 2023-08-18 DIAGNOSIS — O2340 Unspecified infection of urinary tract in pregnancy, unspecified trimester: Secondary | ICD-10-CM

## 2023-08-18 LAB — URINE CULTURE

## 2023-08-18 NOTE — Progress Notes (Signed)
 Urine TOC negative. Will resolve UTI from problem list.   Dorothyann Helling, MD 08/18/23  1:55 PM

## 2023-08-23 ENCOUNTER — Encounter

## 2023-08-24 ENCOUNTER — Ambulatory Visit

## 2023-08-24 ENCOUNTER — Other Ambulatory Visit: Payer: Self-pay | Admitting: Family Medicine

## 2023-08-24 ENCOUNTER — Encounter

## 2023-08-24 DIAGNOSIS — Z3483 Encounter for supervision of other normal pregnancy, third trimester: Secondary | ICD-10-CM

## 2023-08-24 DIAGNOSIS — D509 Iron deficiency anemia, unspecified: Secondary | ICD-10-CM | POA: Insufficient documentation

## 2023-08-24 DIAGNOSIS — O99013 Anemia complicating pregnancy, third trimester: Secondary | ICD-10-CM

## 2023-08-24 DIAGNOSIS — Z3480 Encounter for supervision of other normal pregnancy, unspecified trimester: Secondary | ICD-10-CM

## 2023-08-24 DIAGNOSIS — O99019 Anemia complicating pregnancy, unspecified trimester: Secondary | ICD-10-CM | POA: Insufficient documentation

## 2023-08-24 LAB — HEMOGLOBIN, FINGERSTICK: Hemoglobin: 9.5 g/dL — ABNORMAL LOW (ref 11.1–15.9)

## 2023-08-24 MED ORDER — IRON (FERROUS SULFATE) 325 (65 FE) MG PO TABS: 1.0000 | ORAL_TABLET | ORAL | Status: AC

## 2023-08-24 NOTE — Progress Notes (Signed)
 Attestation of Attending Supervision of RN:  I was consulted regarding this patient case and agree with the documentation below.   Dorothyann Helling, MD Clinical Services Medical Director [facility] Department 08/24/23  2:50 PM

## 2023-08-24 NOTE — Progress Notes (Signed)
 Presents for 28 week labs to [facility] as no available Nurse Clinic appt when client available for testing. Per client, could not have done at her last maternity clinic appt as lab was closed. Aware of next [facility] RV appt on 08/31/23. Accompanied today by her partner. On return from lab, iron  reviewed and = 9.5. Iron  initiated every other day per standing order. As client has been intermittently taking prenatal vitamin, counseled on importance of taking prenatal vitamin every day. Encouraged to set alarm on cell phone to assist in remembering to take. Counseled to take iron  tablet every other day with a Vitamin C beverage to increase iron  absorption. Counseled to take iron  several hours apart from prenatal vitamin. Counseled to increase iron  rich foods in her diet. Client and partner verbalized understanding of above instructions. Will repeat hgb in 4 weeks. [facility] was down when anemia profile ordered. Anemia Profile ordered by sending client to lab with paper encounter and Anemia Profile initialed on encounter. Burnadette Lowers, RN

## 2023-08-25 ENCOUNTER — Ambulatory Visit: Payer: Self-pay

## 2023-08-25 ENCOUNTER — Ambulatory Visit: Payer: Self-pay | Admitting: Family Medicine

## 2023-08-25 DIAGNOSIS — Z3480 Encounter for supervision of other normal pregnancy, unspecified trimester: Secondary | ICD-10-CM

## 2023-08-25 LAB — FE+CBC/D/PLT+TIBC+FER+RETIC
Basophils Absolute: 0 x10E3/uL (ref 0.0–0.2)
Basos: 0 %
EOS (ABSOLUTE): 0.2 x10E3/uL (ref 0.0–0.4)
Eos: 3 %
Ferritin: 8 ng/mL — ABNORMAL LOW (ref 15–150)
Hematocrit: 30.5 % — ABNORMAL LOW (ref 34.0–46.6)
Hemoglobin: 9.7 g/dL — ABNORMAL LOW (ref 11.1–15.9)
Immature Grans (Abs): 0 x10E3/uL (ref 0.0–0.1)
Immature Granulocytes: 0 %
Iron Saturation: 6 % — CL (ref 15–55)
Iron: 34 ug/dL (ref 27–159)
Lymphocytes Absolute: 1.3 x10E3/uL (ref 0.7–3.1)
Lymphs: 18 %
MCH: 29.1 pg (ref 26.6–33.0)
MCHC: 31.8 g/dL (ref 31.5–35.7)
MCV: 92 fL (ref 79–97)
Monocytes Absolute: 0.5 x10E3/uL (ref 0.1–0.9)
Monocytes: 7 %
Neutrophils Absolute: 5.1 x10E3/uL (ref 1.4–7.0)
Neutrophils: 72 %
Platelets: 273 x10E3/uL (ref 150–450)
RBC: 3.33 x10E6/uL — ABNORMAL LOW (ref 3.77–5.28)
RDW: 13.1 % (ref 11.7–15.4)
Retic Ct Pct: 1.7 % (ref 0.6–2.6)
Total Iron Binding Capacity: 551 ug/dL (ref 250–450)
UIBC: 517 ug/dL — ABNORMAL HIGH (ref 131–425)
WBC: 7.1 x10E3/uL (ref 3.4–10.8)

## 2023-08-25 NOTE — Progress Notes (Signed)
 Anemia panel confirms iron  deficiency anemia. Already started on PO iron  every other day. Next recheck around [redacted] weeks GA.   Dorothyann Helling, MD 08/25/23  12:04 PM

## 2023-08-26 ENCOUNTER — Encounter: Payer: Self-pay | Admitting: Family Medicine

## 2023-08-26 ENCOUNTER — Telehealth: Payer: Self-pay

## 2023-08-26 DIAGNOSIS — O99810 Abnormal glucose complicating pregnancy: Secondary | ICD-10-CM | POA: Insufficient documentation

## 2023-08-26 LAB — HIV-1/HIV-2 QUALITATIVE RNA
HIV-1 RNA, Qualitative: NONREACTIVE
HIV-2 RNA, Qualitative: NONREACTIVE

## 2023-08-26 LAB — GLUCOSE, 1 HOUR GESTATIONAL: Gestational Diabetes Screen: 156 mg/dL — ABNORMAL HIGH (ref 70–139)

## 2023-08-26 LAB — RPR: RPR Ser Ql: NONREACTIVE

## 2023-08-26 NOTE — Progress Notes (Signed)
 Elevated 1 hr GTT. Needs confirmatory 3 hr GTT. Forwarding to [facility] RN pool for scheduling.   Negative RPR, HIV.   Dorothyann Helling, MD 08/26/23  9:21 AM

## 2023-08-26 NOTE — Telephone Encounter (Signed)
 Call to client to notify her of need for 3 hour GTT as 1 hour GTT elevated. Counseled on importance of knowing if has pregnancy diabetes. Test prep instructions reviewed with client who verbalizes understanding and states has done this in the past. Per client, unable to come for the test in the mornings due to childcare issues. Counseled okay to bring her children with her for the testing. Client states has no transportation for her and all her children. Per client, mother keeps her children and does not know her mom's schedule. Client has [facility] RV appt 08/31/23 (to arrive at 1020) and states friend who is bringing her to the appt has 2 children. Counseled to discuss test with provider at that appt (noted on pink sticky). While documenting this note, return call x2 to client to ascertain if friend could bring her between 0800 and 0830 Tuesday am for testing and appt. No answer to either call. Burnadette Lowers, RN

## 2023-08-31 ENCOUNTER — Encounter: Payer: Self-pay | Admitting: Physician Assistant

## 2023-08-31 ENCOUNTER — Ambulatory Visit: Admitting: Physician Assistant

## 2023-08-31 VITALS — BP 122/62 | HR 92 | Temp 97.7°F | Wt 136.4 lb

## 2023-08-31 DIAGNOSIS — Z3483 Encounter for supervision of other normal pregnancy, third trimester: Secondary | ICD-10-CM

## 2023-08-31 DIAGNOSIS — O99810 Abnormal glucose complicating pregnancy: Secondary | ICD-10-CM

## 2023-08-31 DIAGNOSIS — D509 Iron deficiency anemia, unspecified: Secondary | ICD-10-CM

## 2023-08-31 DIAGNOSIS — Z3480 Encounter for supervision of other normal pregnancy, unspecified trimester: Secondary | ICD-10-CM

## 2023-08-31 DIAGNOSIS — Z3A30 30 weeks gestation of pregnancy: Secondary | ICD-10-CM

## 2023-08-31 DIAGNOSIS — O99013 Anemia complicating pregnancy, third trimester: Secondary | ICD-10-CM

## 2023-08-31 DIAGNOSIS — Z8759 Personal history of other complications of pregnancy, childbirth and the puerperium: Secondary | ICD-10-CM

## 2023-08-31 NOTE — Progress Notes (Signed)
  [facility] [address] [place] [address] Main phone: [phone]  Prenatal Visit  Subjective:  Natalie Mason is a 23 y.o. H2E6966 at [redacted]w[redacted]d being seen today with partner for ongoing prenatal care.  She is currently monitored for the following issues for this low-risk pregnancy:   Patient Active Problem List   Diagnosis Date Noted   Abnormal glucose affecting pregnancy 08/26/2023   Iron  deficiency anemia during pregnancy 08/24/2023   Supervision of other normal pregnancy, antepartum 03/29/2023   ASCUS with positive high risk HPV cervical 02/10/2021   History of gestational hypertension 10/23/2018   Patient reports no complaints.  Contractions: Not present. Vag. Bleeding: None.  Movement: Present. Denies leaking of fluid/ROM.   The following portions of the patient's history were reviewed and updated as appropriate: allergies, current medications, past family history, past medical history, past social history, past surgical history and problem list. Problem list updated.  Objective:   Vitals:   08/31/23 1029  BP: 122/62  Pulse: 92  Temp: 97.7 F (36.5 C)  Weight: 136 lb 6.4 oz (61.9 kg)    Fetal Status: Fetal Heart Rate (bpm): 134 Fundal Height: 30 cm Movement: Present     General:  Alert, oriented and cooperative. Patient is in no acute distress.  Skin: Skin is warm and dry. No rash noted.   Cardiovascular: Normal heart rate noted  Respiratory: Normal respiratory effort, no problems with respiration noted  Abdomen: Soft, gravid, appropriate for gestational age.  Pain/Pressure: Absent     Pelvic: Cervical exam deferred        Extremities: Normal range of motion.  Edema: None  Mental Status: Normal mood and affect. Normal behavior. Normal judgment and thought content.   Assessment and Plan:  Pregnancy: H2E6966 at [redacted]w[redacted]d  1. [redacted] weeks gestation of pregnancy RV in 2 weeks.  2. Supervision of  other normal pregnancy, antepartum (Primary) Encouraged to drink plenty of water in light of very hot weather.  3. Abnormal glucose affecting pregnancy Scheduled for 3h GTT to f/u abnormal 1h GTT.  4. Iron  deficiency anemia during pregnancy Continue oral iron .  5. History of gestational hypertension BP normal today - continue to monitor.   Preterm labor symptoms and general obstetric precautions including but not limited to vaginal bleeding, contractions, leaking of fluid and fetal movement were reviewed in detail with the patient. Please refer to After Visit Summary for other counseling recommendations.  Return in about 2 weeks (around 09/14/2023) for Routine prenatal care.  Future Appointments  Date Time Provider Department Center  09/06/2023  8:20 AM [facility] PROVIDER [facility] None  09/14/2023 10:20 AM [facility] PROVIDER [facility] None    Clotilda Hafer, PA-C

## 2023-08-31 NOTE — Progress Notes (Signed)
 Here for MH RV at 30 6/7. Now scheduled for 3 hour gtt on 09/06/23 and revisit on 09/14/2023. Patient states she takes Gisele to get to her appointments and if she doesn't have the money to come on 8/4 she will call to reschedule.SABRA SABRAIzetta Parish, RN

## 2023-09-06 ENCOUNTER — Encounter

## 2023-09-06 ENCOUNTER — Telehealth: Payer: Self-pay

## 2023-09-06 ENCOUNTER — Telehealth: Payer: Self-pay | Admitting: Family Medicine

## 2023-09-06 NOTE — Telephone Encounter (Signed)
 Called due to patient had not arrived to this morning's appointment. Patient asked to reschedule today's LAB draw for the morning of 09/14/2023. Consuelo Kingsley RN

## 2023-09-14 ENCOUNTER — Other Ambulatory Visit

## 2023-09-14 ENCOUNTER — Encounter: Payer: Self-pay | Admitting: Nurse Practitioner

## 2023-09-14 ENCOUNTER — Ambulatory Visit: Admitting: Nurse Practitioner

## 2023-09-14 VITALS — BP 109/67 | HR 106 | Temp 97.7°F | Wt 139.8 lb

## 2023-09-14 DIAGNOSIS — O99810 Abnormal glucose complicating pregnancy: Secondary | ICD-10-CM

## 2023-09-14 DIAGNOSIS — D509 Iron deficiency anemia, unspecified: Secondary | ICD-10-CM

## 2023-09-14 DIAGNOSIS — Z3483 Encounter for supervision of other normal pregnancy, third trimester: Secondary | ICD-10-CM

## 2023-09-14 DIAGNOSIS — Z3480 Encounter for supervision of other normal pregnancy, unspecified trimester: Secondary | ICD-10-CM

## 2023-09-14 DIAGNOSIS — Z8759 Personal history of other complications of pregnancy, childbirth and the puerperium: Secondary | ICD-10-CM

## 2023-09-14 DIAGNOSIS — O99013 Anemia complicating pregnancy, third trimester: Secondary | ICD-10-CM

## 2023-09-14 NOTE — Progress Notes (Signed)
  [facility] [address], [address] [place] [address] Main phone: [phone]  Prenatal Visit  Subjective:  Natalie Mason is a 23 y.o. H2E6966 at [redacted]w[redacted]d being seen today for ongoing prenatal care, accompanied by her partner, Natalie Mason.  She is currently monitored for the following issues for this low-risk pregnancy:   Patient Active Problem List   Diagnosis Date Noted   Abnormal glucose affecting pregnancy 08/26/2023   Iron  deficiency anemia during pregnancy 08/24/2023   Supervision of other normal pregnancy, antepartum 03/29/2023   ASCUS with positive high risk HPV cervical 02/10/2021   History of gestational hypertension 10/23/2018   Patient reports occasional vaginal lightning pain.  Contractions: Not present. Vag. Bleeding: None.  Movement: Present. Denies leaking of fluid/ROM.   The following portions of the patient's history were reviewed and updated as appropriate: allergies, current medications, past family history, past medical history, past social history, past surgical history and problem list. Problem list updated.  Objective:   Vitals:   09/14/23 1014  BP: 109/67  Pulse: (!) 106  Temp: 97.7 F (36.5 C)  Weight: 139 lb 12.8 oz (63.4 kg)    Fetal Status: Fetal Heart Rate (bpm): 145 Fundal Height: 31 cm Movement: Present  Presentation: Vertex  General:  Alert, oriented and cooperative. Patient is in no acute distress.  Skin: Skin is warm and dry. No rash noted.   Cardiovascular: Normal heart rate noted  Respiratory: Normal respiratory effort, no problems with respiration noted  Abdomen: Soft, gravid, appropriate for gestational age.  Pain/Pressure: Present (occasional vaginal lightning pain)     Pelvic: Cervical exam deferred        Extremities: Normal range of motion.  Edema: None  Mental Status: Normal mood and affect. Normal behavior. Normal judgment and thought content.   Assessment and Plan:   Pregnancy: H2E6966 at 105w6d  1. Supervision of other normal pregnancy, antepartum (Primary) 40 lb 12.8 oz (18.5 kg) Gained 3 lbs in 2 weeks She also requests a letter for her apartment explaining that she is on unpaid leave from her job. This was provided today.   2. History of gestational hypertension BP WNL today  3. Iron  deficiency anemia during pregnancy Taking PO fe Recheck Hgb at next visit  4. Abnormal glucose affecting pregnancy 3hr GTT today. Discussed that 2 elevated values= GDM   Preterm labor symptoms and general obstetric precautions including but not limited to vaginal bleeding, contractions, leaking of fluid and fetal movement were reviewed in detail with the patient. Please refer to After Visit Summary for other counseling recommendations.  Return in about 2 weeks (around 09/28/2023) for Routine PNC.  No future appointments.  Fabiola Mudgett K Madelin Weseman, NP

## 2023-09-14 NOTE — Progress Notes (Signed)
 In clinic for 3hr GTT. Reports NPO since last night around 9:30PM. Instructions given for test today. Advised to notify RN if she feels nauseous or that she may vomit. Next [facility] appt today at 10:20AM. All questions answered and verbalizes understanding.   Doyce CINDERELLA Shuck, RN

## 2023-09-14 NOTE — Progress Notes (Signed)
 Here for MH RV at 32 6/7. Needs to be to lab by 10:28 for next blood draw. Doing 3 hour gtt today. Hulan Parish, RN

## 2023-09-15 LAB — GESTATIONAL GLUCOSE TOLERANCE
Glucose, Fasting: 80 mg/dL (ref 70–94)
Glucose, GTT - 1 Hour: 125 mg/dL (ref 70–179)
Glucose, GTT - 2 Hour: 106 mg/dL (ref 70–154)
Glucose, GTT - 3 Hour: 97 mg/dL (ref 70–139)

## 2023-09-19 ENCOUNTER — Ambulatory Visit: Payer: Self-pay | Admitting: Family Medicine

## 2023-09-19 NOTE — Progress Notes (Signed)
 3 hr GTT normal.   Natalie Helling, MD 09/19/23  6:45 PM

## 2023-10-01 ENCOUNTER — Ambulatory Visit: Admitting: Family Medicine

## 2023-10-01 VITALS — BP 102/53 | HR 101 | Temp 97.0°F | Wt 142.4 lb

## 2023-10-01 DIAGNOSIS — Z3483 Encounter for supervision of other normal pregnancy, third trimester: Secondary | ICD-10-CM

## 2023-10-01 DIAGNOSIS — O99019 Anemia complicating pregnancy, unspecified trimester: Secondary | ICD-10-CM

## 2023-10-01 DIAGNOSIS — Z3A35 35 weeks gestation of pregnancy: Secondary | ICD-10-CM

## 2023-10-01 DIAGNOSIS — Z3480 Encounter for supervision of other normal pregnancy, unspecified trimester: Secondary | ICD-10-CM

## 2023-10-01 DIAGNOSIS — O99013 Anemia complicating pregnancy, third trimester: Secondary | ICD-10-CM

## 2023-10-01 DIAGNOSIS — D509 Iron deficiency anemia, unspecified: Secondary | ICD-10-CM

## 2023-10-01 LAB — HEMOGLOBIN, FINGERSTICK: Hemoglobin: 9.9 g/dL — ABNORMAL LOW (ref 11.1–15.9)

## 2023-10-01 NOTE — Progress Notes (Signed)
 In house hgb result reviewed during visit. Declines Flu vaccine.Consuelo Kingsley RN

## 2023-10-05 ENCOUNTER — Ambulatory Visit: Payer: Self-pay

## 2023-10-10 NOTE — Assessment & Plan Note (Signed)
 Doing well. BP normal at 102/53. TWG 43 lb 6.4 oz (19.7 kg). Taking prenatal vitamin daily and iron  tablet every other day. Next prenatal appointment in 1 weeks; routine 36 week labs at next visit.

## 2023-10-10 NOTE — Progress Notes (Signed)
  [facility] [address] [place] [address] Main phone: [phone]  Prenatal Visit  Subjective:  Natalie Mason is a 23 y.o. H2E6966 at [redacted]w[redacted]d being seen today for ongoing prenatal care.  She is currently monitored for the following issues for this low-risk pregnancy:   Patient Active Problem List   Diagnosis Date Noted   Abnormal glucose affecting pregnancy 08/26/2023   Iron  deficiency anemia during pregnancy 08/24/2023   Supervision of other normal pregnancy, antepartum 03/29/2023   ASCUS with positive high risk HPV cervical 02/10/2021   History of gestational hypertension 10/23/2018   Patient reports intermittent Braxton Hicks contractions every 3-4 days.  Contractions: Irregular. Vag. Bleeding: None.  Movement: Present. Denies leaking of fluid/ROM.   The following portions of the patient's history were reviewed and updated as appropriate: allergies, current medications, past family history, past medical history, past social history, past surgical history and problem list. Problem list updated.  Objective:   Vitals:   10/01/23 1543  BP: (!) 102/53  Pulse: (!) 101  Temp: (!) 97 F (36.1 C)  Weight: 142 lb 6.4 oz (64.6 kg)   Fetal Status: Fetal Heart Rate (bpm): 138 Fundal Height: 34 cm Movement: Present     General:  Alert, oriented and cooperative. Patient is in no acute distress.  Skin: Skin is warm and dry. No rash noted.   Cardiovascular: Normal heart rate noted  Respiratory: Normal respiratory effort, no problems with respiration noted  Abdomen: Soft, gravid, appropriate for gestational age.  Pain/Pressure: Absent     Pelvic: Cervical exam deferred        Extremities: Normal range of motion.     Mental Status: Normal mood and affect. Normal behavior. Normal judgment and thought content.   Assessment and Plan:  Pregnancy: H2E6966 at [redacted]w[redacted]d  [redacted] weeks gestation of pregnancy  Supervision of  other normal pregnancy, antepartum Assessment & Plan: Doing well. BP normal at 102/53. TWG 43 lb 6.4 oz (19.7 kg). Taking prenatal vitamin daily and iron  tablet every other day. Next prenatal appointment in 1 weeks; routine 36 week labs at next visit.    Iron  deficiency anemia during pregnancy Assessment & Plan: Hgb 9.9 in clinic. Discussed with patient. Will continue iron  PO every other day. Next recheck around delivery.   Orders: -     Hemoglobin, fingerstick   Preterm labor symptoms and general obstetric precautions including but not limited to vaginal bleeding, contractions, leaking of fluid and fetal movement were reviewed in detail with the patient. Please refer to After Visit Summary for other counseling recommendations.  No follow-ups on file.  Future Appointments  Date Time Provider Department Center  10/11/2023  4:00 PM [facility] PROVIDER [facility] None   Betsey CHRISTELLA Helling, MD

## 2023-10-10 NOTE — Assessment & Plan Note (Signed)
 Hgb 9.9 in clinic. Discussed with patient. Will continue iron  PO every other day. Next recheck around delivery.

## 2023-10-11 ENCOUNTER — Ambulatory Visit

## 2023-10-11 ENCOUNTER — Telehealth: Payer: Self-pay

## 2023-10-11 NOTE — Telephone Encounter (Signed)
 Call to client early this am to reschedule 10/11/23 [facility] RV appt as no available provider due to illness. Appt rescheduled for 10/15/23 per client request. Burnadette Lowers, RN

## 2023-10-15 ENCOUNTER — Ambulatory Visit

## 2023-10-20 ENCOUNTER — Ambulatory Visit: Admitting: Physician Assistant

## 2023-10-20 ENCOUNTER — Encounter: Payer: Self-pay | Admitting: Physician Assistant

## 2023-10-20 VITALS — BP 108/62 | HR 89 | Temp 97.8°F | Wt 146.0 lb

## 2023-10-20 DIAGNOSIS — D509 Iron deficiency anemia, unspecified: Secondary | ICD-10-CM

## 2023-10-20 DIAGNOSIS — Z3480 Encounter for supervision of other normal pregnancy, unspecified trimester: Secondary | ICD-10-CM

## 2023-10-20 DIAGNOSIS — O99013 Anemia complicating pregnancy, third trimester: Secondary | ICD-10-CM

## 2023-10-20 DIAGNOSIS — Z3483 Encounter for supervision of other normal pregnancy, third trimester: Secondary | ICD-10-CM

## 2023-10-20 DIAGNOSIS — Z8759 Personal history of other complications of pregnancy, childbirth and the puerperium: Secondary | ICD-10-CM

## 2023-10-20 DIAGNOSIS — O99810 Abnormal glucose complicating pregnancy: Secondary | ICD-10-CM

## 2023-10-20 DIAGNOSIS — Z3A38 38 weeks gestation of pregnancy: Secondary | ICD-10-CM

## 2023-10-20 NOTE — Progress Notes (Signed)
 Here for MH RV at 38 weeks. CMHRP and [place] today. Declines flu vaccine. Would like provider to collect labs today.Hulan Parish, RN

## 2023-10-20 NOTE — Progress Notes (Addendum)
  [facility] [address] [place] [address] Main phone: [phone]  Prenatal Visit  Subjective:  Natalie Mason is a 23 y.o. H2E6966 at [redacted]w[redacted]d being seen today for ongoing prenatal care.  She is currently monitored for the following issues for this low-risk pregnancy:   Patient Active Problem List   Diagnosis Date Noted   Abnormal glucose affecting pregnancy 08/26/2023   Iron  deficiency anemia during pregnancy 08/24/2023   Supervision of other normal pregnancy, antepartum 03/29/2023   ASCUS with positive high risk HPV cervical 02/10/2021   History of gestational hypertension 10/23/2018   Patient reports no complaints.  Contractions: Not present. Vag. Bleeding: None.  Movement: Present. Denies leaking of fluid/ROM.   The following portions of the patient's history were reviewed and updated as appropriate: allergies, current medications, past family history, past medical history, past social history, past surgical history and problem list. Problem list updated.  Objective:   Vitals:   10/20/23 1309  BP: 108/62  Pulse: 89  Temp: 97.8 F (36.6 C)  Weight: 146 lb (66.2 kg)    Fetal Status: Fetal Heart Rate (bpm): 148 Fundal Height: 37 cm Movement: Present  Presentation: Vertex  General:  Alert, oriented and cooperative. Patient is in no acute distress.  Skin: Skin is warm and dry. No rash noted.   Cardiovascular: Normal heart rate noted  Respiratory: Normal respiratory effort, no problems with respiration noted  Abdomen: Soft, gravid, appropriate for gestational age.  Pain/Pressure: Absent     Pelvic: Cervical exam deferred        Extremities: Normal range of motion.  Edema: None  Mental Status: Normal mood and affect. Normal behavior. Normal judgment and thought content.   Assessment and Plan:  Pregnancy: H2E6966 at [redacted]w[redacted]d  1. [redacted] weeks gestation of pregnancy RV in 1 week.  2. Supervision of  other normal pregnancy, antepartum (Primary) Routine rectovaginal testing today. Patient prefers provider collection. EPDS reviewed and score today is 0/low risk. Continue prenatal vitamins. - Strep Gp B Culture+Rflx - Chlamydia/GC NAA, Confirmation  3. Iron  deficiency anemia during pregnancy Has been taking oral iron  every other day. Most recent hgb 9.9 g/dL on 1/70/74. Phone consult with Dr. Dorothyann Helling re: potential need for IV iron , and in light of borderline hgb, late pregnancy and asymptomatic status, consider IV iron  infusion at delivery. Patient in agreement with this plan. This is added to problem list.  4. History of gestational hypertension BP normal at 108/62 today, will continue to monitor.  5. Abnormal glucose affecting pregnancy 3h GTT reviewed and WNL. No further f/u indicated.   Term labor symptoms and general obstetric precautions including but not limited to vaginal bleeding, contractions, leaking of fluid and fetal movement were reviewed in detail with the patient. Please refer to After Visit Summary for other counseling recommendations.  Return in about 1 week (around 10/27/2023) for Routine prenatal care.  No future appointments.  Kindrick Lankford, PA-C

## 2023-10-24 LAB — CHLAMYDIA/GC NAA, CONFIRMATION
Chlamydia trachomatis, NAA: NEGATIVE
Neisseria gonorrhoeae, NAA: NEGATIVE

## 2023-10-24 LAB — STREP GP B CULTURE+RFLX: Strep Gp B Culture+Rflx: NEGATIVE

## 2023-10-25 ENCOUNTER — Encounter: Payer: Self-pay | Admitting: Family Medicine

## 2023-10-25 ENCOUNTER — Ambulatory Visit: Payer: Self-pay | Admitting: Family Medicine

## 2023-10-25 DIAGNOSIS — Z3480 Encounter for supervision of other normal pregnancy, unspecified trimester: Secondary | ICD-10-CM

## 2023-10-25 NOTE — Progress Notes (Signed)
 Reviewed 36 week labs. Negative GBS, gonorrhea, and chlamydia swabs. No action needed at this time.   Dorothyann Helling, MD 10/25/23  5:13 PM

## 2023-11-03 ENCOUNTER — Observation Stay
Admission: EM | Admit: 2023-11-03 | Discharge: 2023-11-04 | Disposition: A | Discharge status: Home / Self Care | Attending: Obstetrics | Admitting: Obstetrics

## 2023-11-03 ENCOUNTER — Other Ambulatory Visit: Payer: Self-pay | Admitting: Obstetrics

## 2023-11-03 ENCOUNTER — Encounter: Payer: Self-pay | Admitting: Obstetrics

## 2023-11-03 ENCOUNTER — Other Ambulatory Visit: Payer: Self-pay

## 2023-11-03 DIAGNOSIS — O471 False labor at or after 37 completed weeks of gestation: Principal | ICD-10-CM | POA: Insufficient documentation

## 2023-11-03 DIAGNOSIS — Z3A40 40 weeks gestation of pregnancy: Secondary | ICD-10-CM | POA: Diagnosis not present

## 2023-11-03 DIAGNOSIS — Z3480 Encounter for supervision of other normal pregnancy, unspecified trimester: Principal | ICD-10-CM

## 2023-11-03 DIAGNOSIS — D509 Iron deficiency anemia, unspecified: Secondary | ICD-10-CM

## 2023-11-03 DIAGNOSIS — O99810 Abnormal glucose complicating pregnancy: Secondary | ICD-10-CM

## 2023-11-03 DIAGNOSIS — O368330 Maternal care for abnormalities of the fetal heart rate or rhythm, third trimester, not applicable or unspecified: Secondary | ICD-10-CM | POA: Insufficient documentation

## 2023-11-03 DIAGNOSIS — O23593 Infection of other part of genital tract in pregnancy, third trimester: Secondary | ICD-10-CM | POA: Diagnosis not present

## 2023-11-03 LAB — WET PREP, GENITAL
Sperm: NONE SEEN
Trich, Wet Prep: NONE SEEN
WBC, Wet Prep HPF POC: >=10 — AB (ref <10)

## 2023-11-03 LAB — RUPTURE OF MEMBRANE (ROM)PLUS: Rom Plus: NEGATIVE

## 2023-11-03 MED ORDER — METRONIDAZOLE 500 MG PO TABS
2000.0000 mg | ORAL_TABLET | Freq: Once | ORAL | Status: AC
  Administered 2023-11-03: 2000 mg via ORAL
  Filled 2023-11-03: qty 4

## 2023-11-03 MED ORDER — ACETAMINOPHEN 325 MG PO TABS: 650.0000 mg | ORAL_TABLET | ORAL | Status: DC | PRN

## 2023-11-03 MED ORDER — LACTATED RINGERS IV SOLN: 500.0000 mL | INTRAVENOUS | Status: DC | PRN

## 2023-11-03 MED ORDER — FLUCONAZOLE 50 MG PO TABS
150.0000 mg | ORAL_TABLET | Freq: Once | ORAL | Status: AC
  Administered 2023-11-03: 150 mg via ORAL
  Filled 2023-11-03: qty 1

## 2023-11-03 MED ORDER — ONDANSETRON 4 MG PO TBDP
4.0000 mg | ORAL_TABLET | Freq: Once | ORAL | Status: AC
  Administered 2023-11-03: 4 mg via ORAL
  Filled 2023-11-03: qty 1

## 2023-11-03 NOTE — OB Triage Provider Note (Signed)
 LABOR & DELIVERY OB TRIAGE NOTE  SUBJECTIVE  HPI Natalie Mason is a 23 y.o. [phone] at [redacted]w[redacted]d who presents to Labor & Delivery for increased discharge/LOF and contractions. Cervical exam remained unchanged over 2 hours in triage.  OB History     Gravida  7   Para  3   Term  3   Preterm      AB  3   Living  3      SAB  3   IAB      Ectopic      Multiple  0   Live Births  3        Obstetric Comments  Pt states she has 3 children and also has had 3 miscarriages.  Says last miscarriage was in 2024 but can't remember other dates.  Shona KATHEE Diesel, RN  03/29/23 Statement above updated - reflected in OB history.         Scheduled Meds: Continuous Infusions:  lactated ringers      PRN Meds:.acetaminophen , lactated ringers   OBJECTIVE  Temp 98.3 F (36.8 C) (Oral)   Resp 15   Ht 5' 4 (1.626 m)   Wt 73.5 kg   LMP 12/19/2022 (Exact Date)   BMI 27.81 kg/m    Cervical exam: Dilation: 3.5 Effacement (%): 50 Station: -1 Exam by:: JONETTA Hope, RN   NST I reviewed the NST and it was reactive.  Baseline: 145 Variability: moderate Accelerations: present Decelerations:none Toco: q 2-6 Category 1  Wet prep: +BV/yeast ROM Plus: negative  ASSESSMENT Impression  1) Pregnancy at H2E6966, [redacted]w[redacted]d, Estimated Date of Delivery: 11/03/23 2) Reassuring maternal/fetal status 3) Membranes intact; prodromal labor 4) BV and yeast vaginitis  PLAN 1) Treated with metronidazole  2g PO and Diflucan 150 mg PO. 2) Discharge home with standard labor/return precautions 3) IOL scheduled for 11/08/23 @ 0800  Eleanor Canny, CNM 11/03/23  11:54 PM

## 2023-11-03 NOTE — OB Triage Note (Addendum)
 Natalie Mason 23 y.o. @[redacted]w[redacted]d  H2E6966 presents to Labor & Delivery triage via wheelchair steered by ED staff reporting contractions and possible leaking of fluid. She states her contractions started 2 days ago and come and go. She noticed increased discharge/leaking of fluid today. She rates the pain a 7/10 shooting pain in her vagina. She denies active vaginal bleeding. She states positive fetal movement. External FM and TOCO applied to non-tender abdomen. Initial FHR 140. Vital signs obtained and within normal limits. Patient oriented to care environment including call bell and bed control use. Swanson, CNM notified of patient's arrival. Send wet prep and rom +. Ahamed Hofland L. Alexey Rhoads, RN BSN 11/03/2023 8:10 PM

## 2023-11-04 ENCOUNTER — Telehealth: Payer: Self-pay

## 2023-11-04 NOTE — Telephone Encounter (Signed)
 TC to client who was last seen 10/20/2023. Scheduled for 11/05/23 as an overbook, patient will arrive at 1:45. Has IOL scheduled for 11/08/23 at 0800, per 11/03/23 ED notes.Hulan Parish, RN

## 2023-11-05 ENCOUNTER — Ambulatory Visit

## 2023-11-05 ENCOUNTER — Other Ambulatory Visit: Payer: Self-pay

## 2023-11-05 ENCOUNTER — Telehealth: Payer: Self-pay

## 2023-11-05 ENCOUNTER — Encounter: Payer: Self-pay | Admitting: Registered Nurse

## 2023-11-05 ENCOUNTER — Inpatient Hospital Stay
Admission: EM | Admit: 2023-11-05 | Discharge: 2023-11-06 | DRG: 776 | Disposition: A | Discharge status: Home / Self Care | Attending: Certified Nurse Midwife | Admitting: Certified Nurse Midwife

## 2023-11-05 DIAGNOSIS — Z3483 Encounter for supervision of other normal pregnancy, third trimester: Secondary | ICD-10-CM | POA: Diagnosis not present

## 2023-11-05 DIAGNOSIS — Z3490 Encounter for supervision of normal pregnancy, unspecified, unspecified trimester: Secondary | ICD-10-CM

## 2023-11-05 LAB — CBC
HCT: 29 % — ABNORMAL LOW (ref 36.0–46.0)
Hemoglobin: 9 g/dL — ABNORMAL LOW (ref 12.0–15.0)
MCH: 25.4 pg — ABNORMAL LOW (ref 26.0–34.0)
MCHC: 31 g/dL (ref 30.0–36.0)
MCV: 81.9 fL (ref 80.0–100.0)
Platelets: 262 K/uL (ref 150–400)
RBC: 3.54 MIL/uL — ABNORMAL LOW (ref 3.87–5.11)
RDW: 15.3 % (ref 11.5–15.5)
WBC: 9.6 K/uL (ref 4.0–10.5)
nRBC: 0 % (ref 0.0–0.2)

## 2023-11-05 LAB — TYPE AND SCREEN
ABO/RH(D): O POS
Antibody Screen: NEGATIVE

## 2023-11-05 LAB — HIV ANTIBODY (ROUTINE TESTING W REFLEX): HIV Screen 4th Generation wRfx: NONREACTIVE

## 2023-11-05 MED ORDER — ACETAMINOPHEN 325 MG PO TABS: 650.0000 mg | ORAL_TABLET | ORAL | Status: DC | PRN

## 2023-11-05 MED ORDER — ONDANSETRON HCL 4 MG PO TABS: 4.0000 mg | ORAL_TABLET | ORAL | Status: DC | PRN

## 2023-11-05 MED ORDER — OXYTOCIN BOLUS FROM INFUSION
333.0000 mL | Freq: Once | INTRAVENOUS | Status: AC
  Administered 2023-11-05: 333 mL via INTRAVENOUS

## 2023-11-05 MED ORDER — IRON SUCROSE 300 MG IVPB - SIMPLE MED
300.0000 mg | Freq: Once | Status: AC
  Administered 2023-11-05: 300 mg via INTRAVENOUS
  Filled 2023-11-05: qty 300

## 2023-11-05 MED ORDER — BENZOCAINE-MENTHOL 20-0.5 % EX AERO
1.0000 | INHALATION_SPRAY | CUTANEOUS | Status: DC | PRN
  Administered 2023-11-05: 1 via TOPICAL
  Filled 2023-11-05: qty 56

## 2023-11-05 MED ORDER — DIBUCAINE (PERIANAL) 1 % EX OINT: 1.0000 | TOPICAL_OINTMENT | CUTANEOUS | Status: DC | PRN

## 2023-11-05 MED ORDER — TETANUS-DIPHTH-ACELL PERTUSSIS 5-2-15.5 LF-MCG/0.5 IM SUSP
0.5000 mL | Freq: Once | INTRAMUSCULAR | Status: DC
  Filled 2023-11-05: qty 0.5

## 2023-11-05 MED ORDER — ZOLPIDEM TARTRATE 5 MG PO TABS: 5.0000 mg | ORAL_TABLET | Freq: Every evening | ORAL | Status: DC | PRN

## 2023-11-05 MED ORDER — IBUPROFEN 100 MG/5ML PO SUSP
600.0000 mg | Freq: Four times a day (QID) | ORAL | Status: DC | PRN
  Administered 2023-11-05 – 2023-11-06 (×4): 600 mg via ORAL
  Filled 2023-11-05 (×7): qty 30

## 2023-11-05 MED ORDER — COCONUT OIL OIL
1.0000 | TOPICAL_OIL | Status: DC | PRN
  Filled 2023-11-05: qty 7.5

## 2023-11-05 MED ORDER — FERROUS SULFATE 325 (65 FE) MG PO TABS
325.0000 mg | ORAL_TABLET | ORAL | Status: DC
  Administered 2023-11-05: 325 mg via ORAL
  Filled 2023-11-05: qty 1

## 2023-11-05 MED ORDER — PRENATAL MULTIVITAMIN CH
1.0000 | ORAL_TABLET | Freq: Every day | ORAL | Status: DC
  Administered 2023-11-06: 1 via ORAL
  Filled 2023-11-05 (×2): qty 1

## 2023-11-05 MED ORDER — OXYTOCIN-SODIUM CHLORIDE 30-0.9 UT/500ML-% IV SOLN
INTRAVENOUS | Status: AC
  Filled 2023-11-05: qty 500

## 2023-11-05 MED ORDER — ACETAMINOPHEN 325 MG PO TABS
650.0000 mg | ORAL_TABLET | ORAL | Status: DC | PRN
  Filled 2023-11-05 (×2): qty 2

## 2023-11-05 MED ORDER — SENNOSIDES-DOCUSATE SODIUM 8.6-50 MG PO TABS
2.0000 | ORAL_TABLET | Freq: Every day | ORAL | Status: DC
  Administered 2023-11-06: 2 via ORAL
  Filled 2023-11-05: qty 2

## 2023-11-05 MED ORDER — DIPHENHYDRAMINE HCL 25 MG PO CAPS: 25.0000 mg | ORAL_CAPSULE | Freq: Four times a day (QID) | ORAL | Status: DC | PRN

## 2023-11-05 MED ORDER — ACETAMINOPHEN 160 MG/5ML PO SOLN
650.0000 mg | Freq: Four times a day (QID) | ORAL | Status: DC | PRN
  Administered 2023-11-05 – 2023-11-06 (×4): 650 mg via ORAL
  Filled 2023-11-05 (×5): qty 20.3

## 2023-11-05 MED ORDER — ONDANSETRON HCL 4 MG/2ML IJ SOLN: 4.0000 mg | INTRAMUSCULAR | Status: DC | PRN

## 2023-11-05 MED ORDER — SIMETHICONE 80 MG PO CHEW: 80.0000 mg | CHEWABLE_TABLET | ORAL | Status: DC | PRN

## 2023-11-05 MED ORDER — WITCH HAZEL-GLYCERIN EX PADS: 1.0000 | MEDICATED_PAD | CUTANEOUS | Status: DC | PRN

## 2023-11-05 MED ORDER — OXYTOCIN-SODIUM CHLORIDE 30-0.9 UT/500ML-% IV SOLN: 2.5000 [IU]/h | INTRAVENOUS | Status: DC

## 2023-11-05 MED ORDER — LIDOCAINE HCL (PF) 1 % IJ SOLN: 30.0000 mL | INTRAMUSCULAR | Status: DC | PRN

## 2023-11-05 NOTE — Telephone Encounter (Signed)
 Call to client to reschedule missed MHC RV appt at EGA = 40 weeks on 11/05/23. Number to call provided. Burnadette Lowers, RN

## 2023-11-05 NOTE — H&P (Signed)
 [facility] Labor & Delivery  History and Physical   HPI   Chief Complaint: Unplanned home birth  Natalie Mason is a 23 y.o. [id number] at [redacted]w[redacted]d who presents for postpartum care following unplanned home birth this evening at 81. She's been having waxing and waning contractions for a few days. She lost her mucous plug this afternoon and called labor and delivery to report that. She was advised to call her provider. Her baby was born at 26 this evening. EMS arrived and assisted with placental delivery around 1740. The infant was transported to the hospital with the patient. Pt denies any hx of PPH, or prior delivery complications. She has had mild anemia this pregnancy and has been taking iron  supplements.     Pregnancy Complications Patient Active Problem List   Diagnosis Date Noted   SVD (spontaneous vaginal delivery) 11/05/2023   Labor and delivery, indication for care 11/03/2023   Abnormal glucose affecting pregnancy 08/26/2023   Iron  deficiency anemia during pregnancy 08/24/2023   Supervision of other normal pregnancy, antepartum 03/29/2023   ASCUS with positive high risk HPV cervical 02/10/2021   History of gestational hypertension 10/23/2018   Review of Systems A twelve point review of systems was negative except as stated in HPI.   HISTORY   Medications Medications Prior to Admission  Medication Sig Dispense Refill Last Dose/Taking   Iron , Ferrous Sulfate , 325 (65 Fe) MG TABS Take 1 tablet by mouth every other day.      Prenatal Vit-Fe Fumarate-FA (MULTIVITAMIN-PRENATAL) 27-0.8 MG TABS tablet Take 1 tablet by mouth daily at 12 noon. Taking intermittently.       Allergies is allergic to cat dander, dog epithelium, ibuprofen , latex, and penicillins.   OB History OB History  Gravida Para Term Preterm AB Living  7 3 3  0 3 3  SAB IAB Ectopic Multiple Live Births  3 0 0 0 3    # Outcome Date GA Lbr Len/2nd Weight Sex Type Anes PTL Lv  7 Current            6 SAB 09/2022             Birth Comments: Based on pain / passage of vaginal contents. Unknown EGA.  5 Term 03/01/21 [redacted]w[redacted]d / 00:05 3235 g M Vag-Spont None  LIV     Name: Carranza,BOY Orlene     Apgar1: 9  Apgar5: 9  4 SAB 2022             Birth Comments: Self diagnosed based on pain  / material passed vaginally. Unknown EGA.     Apgar5: 2  3 Term 11/04/18 [redacted]w[redacted]d 02:21 / 00:07 2910 g M Vag-Spont None  LIV     Name: Hauswirth,BOY Zyrah     Apgar1: 9  Apgar5: 9  2 SAB 10/18/17 [redacted]w[redacted]d            Birth Comments: Reports pregnancy was 8 weeks and baby size was 6 weeks. No D & C.  1 Term 05/03/17 [redacted]w[redacted]d 08:38 / 00:16 2840 g M Vag-Spont None  LIV     Name: Mejorado KASPRZAK,MICHAEL LARONTE     Apgar1: 9  Apgar5: 9    Obstetric Comments  Pt states she has 3 children and also has had 3 miscarriages.  Says last miscarriage was in 2024 but can't remember other dates.  Natalie KATHEE Diesel, Natalie Mason   03/29/23 Statement above updated - reflected in OB history.    Past Medical History Past Medical  History:  Diagnosis Date   Anterior leg pain 07/15/2021   ASCUS with positive high risk HPV cervical 02/10/2021   Chronic cough 01/23/2022   Constipation 05/01/2021   Iron  deficiency anemia 02/10/2021   Iron  deficiency anemia 02/10/2021   Mental disorder    Does not know dx, very young at time of dx, saw therapist. (States last saw therapist at age 16 years).   Pregnancy induced hypertension    first pregnancy   Supervision of normal pregnancy 07/09/2020              Nursing Staff    Provider      Office Location     [facility]     Dating     [redacted]w[redacted]d U/S      Language     English    Anatomy US      Normal, female      Flu Vaccine     declined    Genetic Screen     declines      TDaP vaccine      12/05/20    Hgb A1C or   GTT    Early 125 passed  Third trimester       Rhogam     N/A         LAB RESULTS       Feeding Plan    Breastfeeding    Blood Type    O/Positive   Urinary tract infection affecting pregnancy, antepartum- E.Coli  04/05/2023   -E.coli on initial PNV  [x]  treatment- ordered macrobid  100mg  BID x 7 days to pharmacy on 04/05/23  [x]  TOC - collected 04/26/23, still (+) for E.coli > 100k > tx w/ fosfomycin sent 3/28  [x ] TOC- collected 5/19, still + for E.coli. 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    Social History  reports that she has never smoked. She has been exposed to tobacco smoke. She has never used smokeless tobacco. She reports that she does not currently use alcohol. She reports that she does not use drugs.   Family History family history includes ADD / ADHD in her son; Anxiety disorder in her mother; Bipolar disorder in her mother, paternal grandmother, and sister; Deafness in her half-brother; Depression in her mother; Diabetes in her maternal grandmother and mother; Healthy in her half-sister, son, and son; Heart disease in her maternal grandmother; Hypertension in her maternal grandmother; Mental illness in her mother; Sleep apnea in her father and maternal grandmother; Thyroid disease in her maternal grandmother.   PHYSICAL EXAM   Vitals:   11/05/23 1852  BP: 115/77  Pulse: (!) 102  Resp: 16  Temp: 99.3 F (37.4 C)  TempSrc: Oral  Weight: 73.5 kg  Height: 5' 4 (1.626 m)    Constitutional: No acute distress, well appearing, and well nourished. Neurologic: She is alert and oriented. Answers questions, but not conversational.  Psychiatric: She has a normal mood and affect.  Musculoskeletal: Normal gait, grossly normal range of motion Cardiovascular: Normal rate.   Pulmonary/Chest: Normal work of breathing.  Gastrointestinal/ Abdominal: Soft. Gravid. There is no tenderness.  Skin: Skin is warm and dry. No rash noted.  Abdomen: Fundus boggy and at umbilicus. Approx clots expressed. Genitourinary: Perineum is intact.   PRENATAL LABS FROM OB RESULTS CONSOLE  ABO, Rh: O/Positive/-- (02/24 1129) Antibody: Negative (02/24 1129) Rubella: 6.99 (02/24 1129) Varicella:   RPR: Non Reactive (07/22 1130)  HBsAg: Negative (02/24 1129)  HC No results found for: HCVAB HIV: Non Reactive (02/24 1129)  GC:   CT:   1h GTT:  Lab Results  Component Value Date   GLUCOSE 79 05/02/2017   3h GTT:  Lab Results  Component Value Date   LABGLUC 90 12/24/2020   GBS: Negative/-- (09/17 1435)  ENCOUNTER LABS  Lab Orders         CBC         RPR         HIV Antibody (routine testing w rflx)     All are pending  ASSESSMENT AND PLAN   Natalie Mason is a 23 y.o. [id number] at [redacted]w[redacted]d with EDD: 11/03/2023, by Ultrasound admitted for postpartum care.   Postpartum care: - Routine admission labs and postpartum orders were placed.  - Pt has allergy to enteric coating on motrin  and tylenol . Liquid forms of both were ordered. - Will give a dose of IV iron  while inpatient, as previously planned.  GBS: negative  Labs/Immunizations: TDAP: Given prenatally. Flu: Declined prenatally RSV: No Rubella: Immune Varicella: Unknown  Postpartum Plan: - Feeding: Breast Milk - Contraception: plans undecided - Prenatal Care Provider: [facility]

## 2023-11-05 NOTE — Progress Notes (Signed)
   11/05/23 1930  Spiritual Encounters  Type of Visit Initial  Care provided to: Pt and family  Conversation partners present during encounter Nurse  Referral source Chaplain assessment  Reason for visit Routine spiritual support  OnCall Visit No  Interventions  Spiritual Care Interventions Made Established relationship of care and support;Reflective listening  Intervention Outcomes  Outcomes Connection to spiritual care  Spiritual Care Plan  Spiritual Care Issues Still Outstanding No further spiritual care needs at this time (see row info)   Chaplain met pt's boyfriend on the elevator and helped him find pt's room. While there chaplain spoke with new mom to make sure she was doing ok after having an unexpected birth at home.

## 2023-11-06 LAB — RPR: RPR Ser Ql: NONREACTIVE

## 2023-11-06 MED ORDER — NORETHINDRONE 0.35 MG PO TABS: 1.0000 | ORAL_TABLET | Freq: Every day | ORAL | 11 refills | Status: AC

## 2023-11-06 NOTE — Progress Notes (Signed)
 Pt. Discharged to Home by Rolin Fuelling at [phone]. Pt. [facility] by DOROTHA Cleaves RN

## 2023-11-06 NOTE — Discharge Instructions (Signed)
Call for Temp. Greater than 100.4  No Tampons, Douches or Sex for 6 weeks. No tub bath for 6 weeks. Go to E.R. For any Shortness of Breath, chest Pain or saturating one or more pads in one hour and for any clots larger than a small lemon. Feelings of depression that make you feel unable to take care of yourself and/or baby. Do not lift anything heavier than your baby. No driving for 2 weeks and/or while you are taking Narcotic medication.  

## 2023-11-06 NOTE — Progress Notes (Signed)
 Progress Note - Vaginal Delivery  Natalie Mason is a 23 y.o. [phone] now PP day 1 s/p Vaginal, Spontaneous.   Subjective:  The patient reports no complaints, up ad lib, voiding, and tolerating PO Pt would like to go home tonight if possible.   Objective:  Vital signs in last 24 hours: Temp:  [98 F (36.7 C)-99.3 F (37.4 C)] 98 F (36.7 C) (10/04 0824) Pulse Rate:  [75-114] 114 (10/04 0824) Resp:  [16-18] 18 (10/04 0824) BP: (103-121)/(63-80) 103/68 (10/04 0824) SpO2:  [99 %-100 %] 99 % (10/04 0824) Weight:  [73.5 kg] 73.5 kg (10/03 1852)  Physical Exam:  General: alert, cooperative, appears stated age, and no distress Lochia: appropriate Uterine Fundus: firm @ u-1    Data Review Recent Labs    11/05/23 1918  HGB 9.0*  HCT 29.0*    Assessment/Plan: Principal Problem:   SVD (spontaneous vaginal delivery) Active Problems:   Labor and delivery, indication for care   Plan for discharge later this evening if baby is discharged.    -- Continue routine PP care.     Zelda Hummer, CNM  11/06/2023 9:38 AM

## 2023-11-06 NOTE — Discharge Summary (Signed)
 Postpartum Discharge Summary  Date of Service updated 11/06/2023     Patient Name: Natalie Mason DOB: 08/05/2000 MRN: [id number]  Date of admission: 11/05/2023 Delivery date:11/05/2023 Delivering provider:   Date of discharge: 11/06/2023  Admitting diagnosis: Pregnancy [Z34.90] Labor and delivery, indication for care [O75.9] Intrauterine pregnancy: [redacted]w[redacted]d     Secondary diagnosis:  Principal Problem:   SVD (spontaneous vaginal delivery) Active Problems:   Labor and delivery, indication for care  Additional problems: none    Discharge diagnosis: Term Pregnancy Delivered                                              Post partum procedures:none Augmentation: N/A Complications: None  Hospital course: Home birth   Magnesium  Sulfate received: No BMZ received: No Rhophylac:N/A MMR:N/A T-DaP:Given prenatally Flu: offered RSV Vaccine received: No Transfusion:No Immunizations administered: Immunization History  Administered Date(s) Administered   HPV Quadrivalent 10/14/2010, 09/04/2013, 08/29/2014   Hepatitis A 05/18/2005, 11/17/2007   Influenza Whole 10/14/2010   Meningococcal Conjugate 09/04/2013   Tdap 10/14/2010, 03/09/2017, 08/23/2018, 12/05/2020, 08/16/2023    Physical exam  Vitals:   11/06/23 0320 11/06/23 0824 11/06/23 1316 11/06/23 1706  BP: 114/77 103/68 107/66 122/64  Pulse: 82 (!) 114 99 95  Resp: 18 18 17 18   Temp: 98.3 F (36.8 C) 98 F (36.7 C) 98 F (36.7 C) 98 F (36.7 C)  TempSrc: Oral Oral Oral Oral  SpO2: 100% 99% 99% 100%  Weight:      Height:       General: alert, cooperative, and no distress Lochia: appropriate Uterine Fundus: firm @u -1 Incision: N/A DVT Evaluation: No evidence of DVT seen on physical exam. No significant calf/ankle edema. Labs: Lab Results  Component Value Date   WBC 9.6 11/05/2023   HGB 9.0 (L) 11/05/2023   HCT 29.0 (L) 11/05/2023   MCV 81.9 11/05/2023   PLT 262 11/05/2023      Latest Ref Rng & Units  05/02/2017    3:45 PM  CMP  Glucose 65 - 99 mg/dL 79   BUN 6 - 20 mg/dL <5   Creatinine 9.49 - 1.00 mg/dL 9.42   Sodium 864 - 854 mmol/L 134   Potassium 3.5 - 5.1 mmol/L 3.2   Chloride 101 - 111 mmol/L 103   CO2 22 - 32 mmol/L 20   Calcium 8.9 - 10.3 mg/dL 8.7   Total Protein 6.5 - 8.1 g/dL 6.7   Total Bilirubin 0.3 - 1.2 mg/dL 0.5   Alkaline Phos 47 - 119 U/L 179   AST 15 - 41 U/L 17   ALT 14 - 54 U/L 9    Edinburgh Score:    11/06/2023    8:09 AM  Edinburgh Postnatal Depression Scale Screening Tool  I have been able to laugh and see the funny side of things. 0  I have looked forward with enjoyment to things. 0  I have blamed myself unnecessarily when things went wrong. 0  I have been anxious or worried for no good reason. 0  I have felt scared or panicky for no good reason. 0  Things have been getting on top of me. 0  I have been so unhappy that I have had difficulty sleeping. 0  I have felt sad or miserable. 0  I have been so unhappy that I have been crying. 0  The thought of harming myself has occurred to me. 0  Edinburgh Postnatal Depression Scale Total 0      After visit meds:  Allergies as of 11/06/2023       Reactions   Cat Dander Itching, Rash   sneezing   Dog Epithelium Itching, Rash   sneezing   Latex    Rash, sensation of skin burning   Penicillins Rash   Has patient had a PCN reaction causing immediate rash, facial/tongue/throat swelling, SOB or lightheadedness with hypotension: Yes Has patient had a PCN reaction causing severe rash involving mucus membranes or skin necrosis: No Has patient had a PCN reaction that required hospitalization: No Has patient had a PCN reaction occurring within the last 10 years: No If all of the above answers are NO, then may proceed with Cephalosporin use. Per pt- allergy was rash only - no facial/tongue swelling         Medication List     TAKE these medications    Iron  (Ferrous Sulfate ) 325 (65 Fe) MG  Tabs Take 1 tablet by mouth every other day.   multivitamin-prenatal 27-0.8 MG Tabs tablet Take 1 tablet by mouth daily at 12 noon. Taking intermittently.   norethindrone  0.35 MG tablet Commonly known as: MICRONOR  Take 1 tablet (0.35 mg total) by mouth daily.         Discharge home in stable condition Infant Feeding: Bottle Infant Disposition:home with mother Discharge instruction: per After Visit Summary and Postpartum booklet. Activity: Advance as tolerated. Pelvic rest for 6 weeks.  Diet: routine diet Anticipated Birth Control: POPs Postpartum Appointment:2 weeks Additional Postpartum F/U: Postpartum Depression checkup Future Appointments:No future appointments. Follow up Visit:      11/06/2023 Zelda Hummer, CNM

## 2023-11-12 NOTE — Progress Notes (Unsigned)
   Virtual Visit via Video Note   I connected with Natalie Mason  on 11/16/23 4:12 PM by a video enabled telemedicine application and verified that I am speaking with the correct person using two identifiers.   Location: Patient: *** Provider: [facility]   I discussed the limitations of evaluation and management by telemedicine and the availability of in person appointments. The patient expressed understanding and agreed to proceed. Subjective:    Subjective  Natalie Mason is a 23 y.o. female who presents for a postpartum visit. She is 11 days  postpartum following a spontaneous vaginal delivery at home . I have fully reviewed the prenatal and intrapartum course. The delivery was at home .  Anesthesia: none.    Postpartum course has been normal. She has no pain and no other concerns. Baby's course has been normal. Baby is feeding by  {breast/bottle:69}. Bleeding {vag bleed:12292}. Bowel function is {normal:32111}. Bladder function is {normal:32111}.  .  Contraception method: {contraceptive method:5051}. Postpartum depression screening:      Review of Systems Pertinent positives noted in HPI. Remainder of comprehensive ROS otherwise negative.     Objective:      Objective [] Expand by Default There were no vitals taken for this visit.  General:  alert, cooperative, and no distress        Assessment:    Normal postpartum course, mood stable.   Plan:      Plan -Anticipatory guidance about the postpartum period -Reviewed s/s of PPD and PPA -Discussed when to seek medical care -Office visit at 6 weeks postpartum  I discussed the assessment and treatment plan with the patient. The patient was provided an opportunity to ask questions and all were answered. The patient agreed with the plan and demonstrated an understanding of the instructions.   The patient was advised to call back or seek an in-person evaluation if the symptoms worsen or if the condition fails to  improve as anticipated.   I provided *** minutes of non-face-to-face time during this encounter.     Lauraine Lakes, CNM

## 2023-11-16 ENCOUNTER — Telehealth: Admitting: Registered Nurse

## 2023-12-15 ENCOUNTER — Ambulatory Visit: Payer: Self-pay | Admitting: Registered Nurse

## 2023-12-15 NOTE — Progress Notes (Deleted)
   SUBJECTIVE  Natalie Mason is a 23 y.o. [phone] who gave birth to a fe/female infant at Unknown weeks via   SVD on 11/05/23 Home Birth.  She is {Breast or Bottle:[phone]}. She is here today for a PP follow-up visit.    ROS  {Ros; complete female:19594}  Mood    Bowel function: Bladder function: Vaginal bleeding: Pain:  Denies difficulty breathing, chest pain, lower extremity pain or swelling, excessive vaginal bleeding, vaginal pain.   Infant:  OBJECTIVE  There is no height or weight on file to calculate BMI.  There were no vitals taken for this visit. Cat dander, Dog epithelium, Latex, and Penicillins  Past Medical/Surgical History Past Medical History:  Diagnosis Date   Anterior leg pain 07/15/2021   ASCUS with positive high risk HPV cervical 02/10/2021   Chronic cough 01/23/2022   Constipation 05/01/2021   Iron  deficiency anemia 02/10/2021   Iron  deficiency anemia 02/10/2021   Mental disorder    Does not know dx, very young at time of dx, saw therapist. (States last saw therapist at age 62 years).   Pregnancy induced hypertension    first pregnancy   Supervision of normal pregnancy 07/09/2020              Nursing Staff    Provider      Office Location     [facility]     Dating     [redacted]w[redacted]d U/S      Language     English    Anatomy US      Normal, female      Flu Vaccine     declined    Genetic Screen     declines      TDaP vaccine      12/05/20    Hgb A1C or   GTT    Early 125 passed  Third trimester       Rhogam     N/A         LAB RESULTS       Feeding Plan    Breastfeeding    Blood Type    O/Positive   Urinary tract infection affecting pregnancy, antepartum- E.Coli 04/05/2023   -E.coli on initial PNV  [x]  treatment- ordered macrobid  100mg  BID x 7 days to pharmacy on 04/05/23  [x]  TOC - collected 04/26/23, still (+) for E.coli > 100k > tx w/ fosfomycin sent 3/28  [x ] TOC- collected 5/19, still + for E.coli. 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    Last Pap: {findings; last pap:13140}  {Exam; complete female:17926}  ASSESSMENT  PLAN  The pregnancy intention screening data noted above was reviewed. Potential methods of contraception were discussed. The patient elected to proceed with No data recorded.  Discussed PP mood changes and coping strategies. Reviewed preventive care (dental, eye exam, vaccines, routine screenings). Follow up in 6 months for annual exam or PRN.  Harlene Gander, CMA

## 2024-03-14 ENCOUNTER — Ambulatory Visit: Payer: Self-pay | Admitting: Family Medicine
